# Patient Record
Sex: Female | Born: 1955 | Race: White | Hispanic: No | Marital: Married | Smoking: Current every day smoker
Health system: Southern US, Community
[De-identification: ages and names within clinical notes are randomized; demographics above are authoritative.]

## PROBLEM LIST (undated history)

## (undated) DIAGNOSIS — I10 Essential (primary) hypertension: Secondary | ICD-10-CM

## (undated) DIAGNOSIS — K219 Gastro-esophageal reflux disease without esophagitis: Secondary | ICD-10-CM

## (undated) DIAGNOSIS — G4710 Hypersomnia, unspecified: Secondary | ICD-10-CM

## (undated) DIAGNOSIS — F32A Depression, unspecified: Secondary | ICD-10-CM

## (undated) DIAGNOSIS — E039 Hypothyroidism, unspecified: Secondary | ICD-10-CM

## (undated) DIAGNOSIS — F419 Anxiety disorder, unspecified: Secondary | ICD-10-CM

## (undated) DIAGNOSIS — E7800 Pure hypercholesterolemia, unspecified: Secondary | ICD-10-CM

## (undated) HISTORY — PX: TUBAL LIGATION: SHX77

## (undated) HISTORY — PX: NASAL SEPTOPLASTY W/ TURBINOPLASTY: SHX2070

## (undated) HISTORY — DX: Pure hypercholesterolemia, unspecified: E78.00

## (undated) HISTORY — DX: Hypothyroidism, unspecified: E03.9

## (undated) HISTORY — PX: NASAL SINUS SURGERY: SHX719

## (undated) HISTORY — DX: Hypersomnia, unspecified: G47.10

---

## 1999-06-07 ENCOUNTER — Other Ambulatory Visit: Admission: RE | Admit: 1999-06-07 | Discharge: 1999-06-07 | Payer: Self-pay | Admitting: *Deleted

## 2000-10-02 ENCOUNTER — Other Ambulatory Visit: Admission: RE | Admit: 2000-10-02 | Discharge: 2000-10-02 | Payer: Self-pay | Admitting: *Deleted

## 2001-08-31 ENCOUNTER — Other Ambulatory Visit: Admission: RE | Admit: 2001-08-31 | Discharge: 2001-08-31 | Payer: Self-pay | Admitting: Obstetrics and Gynecology

## 2003-11-19 ENCOUNTER — Other Ambulatory Visit: Admission: RE | Admit: 2003-11-19 | Discharge: 2003-11-19 | Payer: Self-pay | Admitting: Obstetrics and Gynecology

## 2004-06-03 ENCOUNTER — Emergency Department (HOSPITAL_COMMUNITY): Admission: EM | Admit: 2004-06-03 | Discharge: 2004-06-03 | Payer: Self-pay | Admitting: Emergency Medicine

## 2004-06-16 ENCOUNTER — Emergency Department (HOSPITAL_COMMUNITY): Admission: EM | Admit: 2004-06-16 | Discharge: 2004-06-16 | Payer: Self-pay | Admitting: Emergency Medicine

## 2004-06-17 ENCOUNTER — Ambulatory Visit (HOSPITAL_COMMUNITY): Admission: RE | Admit: 2004-06-17 | Discharge: 2004-06-17 | Payer: Self-pay | Admitting: Emergency Medicine

## 2005-08-19 ENCOUNTER — Other Ambulatory Visit: Admission: RE | Admit: 2005-08-19 | Discharge: 2005-08-19 | Payer: Self-pay | Admitting: Obstetrics and Gynecology

## 2005-08-22 ENCOUNTER — Encounter: Admission: RE | Admit: 2005-08-22 | Discharge: 2005-09-28 | Payer: Self-pay | Admitting: Neurosurgery

## 2005-09-23 ENCOUNTER — Ambulatory Visit: Payer: Self-pay | Admitting: Physical Medicine & Rehabilitation

## 2005-09-23 ENCOUNTER — Encounter
Admission: RE | Admit: 2005-09-23 | Discharge: 2005-12-22 | Payer: Self-pay | Admitting: Physical Medicine & Rehabilitation

## 2005-11-17 ENCOUNTER — Ambulatory Visit: Payer: Self-pay | Admitting: Physical Medicine & Rehabilitation

## 2006-01-10 ENCOUNTER — Ambulatory Visit: Payer: Self-pay | Admitting: Physical Medicine & Rehabilitation

## 2006-01-10 ENCOUNTER — Encounter
Admission: RE | Admit: 2006-01-10 | Discharge: 2006-04-10 | Payer: Self-pay | Admitting: Physical Medicine & Rehabilitation

## 2006-05-05 ENCOUNTER — Ambulatory Visit: Payer: Self-pay | Admitting: Physical Medicine & Rehabilitation

## 2006-05-05 ENCOUNTER — Encounter
Admission: RE | Admit: 2006-05-05 | Discharge: 2006-08-03 | Payer: Self-pay | Admitting: Physical Medicine & Rehabilitation

## 2006-07-28 ENCOUNTER — Ambulatory Visit: Payer: Self-pay | Admitting: Physical Medicine & Rehabilitation

## 2006-07-28 ENCOUNTER — Encounter
Admission: RE | Admit: 2006-07-28 | Discharge: 2006-10-03 | Payer: Self-pay | Admitting: Physical Medicine & Rehabilitation

## 2006-09-27 ENCOUNTER — Other Ambulatory Visit: Admission: RE | Admit: 2006-09-27 | Discharge: 2006-09-27 | Payer: Self-pay | Admitting: Obstetrics and Gynecology

## 2006-10-20 ENCOUNTER — Encounter
Admission: RE | Admit: 2006-10-20 | Discharge: 2006-10-23 | Payer: Self-pay | Admitting: Physical Medicine & Rehabilitation

## 2006-10-20 ENCOUNTER — Ambulatory Visit: Payer: Self-pay | Admitting: Physical Medicine & Rehabilitation

## 2007-02-07 ENCOUNTER — Encounter
Admission: RE | Admit: 2007-02-07 | Discharge: 2007-02-08 | Payer: Self-pay | Admitting: Physical Medicine & Rehabilitation

## 2007-02-07 ENCOUNTER — Ambulatory Visit: Payer: Self-pay | Admitting: Physical Medicine & Rehabilitation

## 2007-05-25 ENCOUNTER — Encounter
Admission: RE | Admit: 2007-05-25 | Discharge: 2007-05-31 | Payer: Self-pay | Admitting: Physical Medicine & Rehabilitation

## 2007-05-31 ENCOUNTER — Ambulatory Visit: Payer: Self-pay | Admitting: Physical Medicine & Rehabilitation

## 2007-10-03 ENCOUNTER — Encounter
Admission: RE | Admit: 2007-10-03 | Discharge: 2007-10-03 | Payer: Self-pay | Admitting: Physical Medicine & Rehabilitation

## 2007-10-03 ENCOUNTER — Ambulatory Visit: Payer: Self-pay | Admitting: Physical Medicine & Rehabilitation

## 2008-01-22 ENCOUNTER — Encounter
Admission: RE | Admit: 2008-01-22 | Discharge: 2008-01-23 | Payer: Self-pay | Admitting: Physical Medicine & Rehabilitation

## 2008-01-23 ENCOUNTER — Ambulatory Visit: Payer: Self-pay | Admitting: Physical Medicine & Rehabilitation

## 2008-03-14 ENCOUNTER — Encounter
Admission: RE | Admit: 2008-03-14 | Discharge: 2008-05-12 | Payer: Self-pay | Admitting: Physical Medicine & Rehabilitation

## 2008-03-17 ENCOUNTER — Ambulatory Visit: Payer: Self-pay | Admitting: Physical Medicine & Rehabilitation

## 2008-04-21 ENCOUNTER — Ambulatory Visit: Payer: Self-pay | Admitting: Internal Medicine

## 2008-04-30 ENCOUNTER — Ambulatory Visit (HOSPITAL_COMMUNITY)
Admission: RE | Admit: 2008-04-30 | Discharge: 2008-04-30 | Payer: Self-pay | Admitting: Physical Medicine & Rehabilitation

## 2008-04-30 ENCOUNTER — Ambulatory Visit (HOSPITAL_BASED_OUTPATIENT_CLINIC_OR_DEPARTMENT_OTHER): Admission: RE | Admit: 2008-04-30 | Discharge: 2008-04-30 | Payer: Self-pay | Admitting: Internal Medicine

## 2008-05-07 ENCOUNTER — Ambulatory Visit: Payer: Self-pay | Admitting: Internal Medicine

## 2008-05-09 ENCOUNTER — Encounter: Payer: Self-pay | Admitting: Internal Medicine

## 2008-05-12 ENCOUNTER — Ambulatory Visit: Payer: Self-pay | Admitting: Physical Medicine & Rehabilitation

## 2008-05-16 ENCOUNTER — Ambulatory Visit: Payer: Self-pay | Admitting: Internal Medicine

## 2008-05-19 ENCOUNTER — Encounter: Payer: Self-pay | Admitting: Internal Medicine

## 2008-05-26 ENCOUNTER — Telehealth: Payer: Self-pay | Admitting: Internal Medicine

## 2008-06-11 ENCOUNTER — Encounter
Admission: RE | Admit: 2008-06-11 | Discharge: 2008-09-09 | Payer: Self-pay | Admitting: Physical Medicine & Rehabilitation

## 2008-06-18 ENCOUNTER — Telehealth: Payer: Self-pay | Admitting: Internal Medicine

## 2008-06-23 ENCOUNTER — Telehealth (INDEPENDENT_AMBULATORY_CARE_PROVIDER_SITE_OTHER): Payer: Self-pay | Admitting: *Deleted

## 2008-06-24 ENCOUNTER — Encounter: Payer: Self-pay | Admitting: Internal Medicine

## 2008-06-27 ENCOUNTER — Telehealth: Payer: Self-pay | Admitting: Internal Medicine

## 2008-07-14 ENCOUNTER — Telehealth: Payer: Self-pay | Admitting: Internal Medicine

## 2008-07-15 ENCOUNTER — Ambulatory Visit: Payer: Self-pay | Admitting: Physical Medicine & Rehabilitation

## 2008-07-15 ENCOUNTER — Telehealth (INDEPENDENT_AMBULATORY_CARE_PROVIDER_SITE_OTHER): Payer: Self-pay | Admitting: *Deleted

## 2008-08-14 ENCOUNTER — Ambulatory Visit: Payer: Self-pay | Admitting: Internal Medicine

## 2008-08-14 DIAGNOSIS — G4710 Hypersomnia, unspecified: Secondary | ICD-10-CM

## 2008-08-25 ENCOUNTER — Ambulatory Visit: Payer: Self-pay | Admitting: Physical Medicine & Rehabilitation

## 2008-09-04 ENCOUNTER — Telehealth (INDEPENDENT_AMBULATORY_CARE_PROVIDER_SITE_OTHER): Payer: Self-pay | Admitting: *Deleted

## 2008-10-01 ENCOUNTER — Telehealth: Payer: Self-pay | Admitting: Internal Medicine

## 2008-10-02 ENCOUNTER — Encounter
Admission: RE | Admit: 2008-10-02 | Discharge: 2008-12-24 | Payer: Self-pay | Admitting: Physical Medicine & Rehabilitation

## 2008-10-02 ENCOUNTER — Telehealth (INDEPENDENT_AMBULATORY_CARE_PROVIDER_SITE_OTHER): Payer: Self-pay | Admitting: *Deleted

## 2008-10-06 ENCOUNTER — Ambulatory Visit: Payer: Self-pay | Admitting: Physical Medicine & Rehabilitation

## 2008-10-07 ENCOUNTER — Telehealth: Payer: Self-pay | Admitting: Internal Medicine

## 2008-10-08 ENCOUNTER — Telehealth: Payer: Self-pay | Admitting: Internal Medicine

## 2008-10-09 ENCOUNTER — Telehealth (INDEPENDENT_AMBULATORY_CARE_PROVIDER_SITE_OTHER): Payer: Self-pay | Admitting: *Deleted

## 2008-11-03 ENCOUNTER — Ambulatory Visit: Payer: Self-pay | Admitting: Physical Medicine & Rehabilitation

## 2008-11-11 ENCOUNTER — Telehealth (INDEPENDENT_AMBULATORY_CARE_PROVIDER_SITE_OTHER): Payer: Self-pay | Admitting: *Deleted

## 2008-12-02 ENCOUNTER — Telehealth (INDEPENDENT_AMBULATORY_CARE_PROVIDER_SITE_OTHER): Payer: Self-pay | Admitting: *Deleted

## 2008-12-12 ENCOUNTER — Telehealth: Payer: Self-pay | Admitting: Internal Medicine

## 2009-01-07 ENCOUNTER — Telehealth: Payer: Self-pay | Admitting: Internal Medicine

## 2009-02-19 ENCOUNTER — Encounter
Admission: RE | Admit: 2009-02-19 | Discharge: 2009-05-20 | Payer: Self-pay | Admitting: Physical Medicine & Rehabilitation

## 2009-03-05 ENCOUNTER — Ambulatory Visit: Payer: Self-pay | Admitting: Internal Medicine

## 2009-03-06 ENCOUNTER — Ambulatory Visit: Payer: Self-pay | Admitting: Physical Medicine & Rehabilitation

## 2009-03-06 ENCOUNTER — Encounter: Payer: Self-pay | Admitting: Internal Medicine

## 2009-03-09 ENCOUNTER — Telehealth: Payer: Self-pay | Admitting: Internal Medicine

## 2009-03-15 ENCOUNTER — Encounter: Payer: Self-pay | Admitting: Internal Medicine

## 2009-03-16 ENCOUNTER — Encounter: Payer: Self-pay | Admitting: Internal Medicine

## 2009-04-02 ENCOUNTER — Telehealth: Payer: Self-pay | Admitting: Internal Medicine

## 2009-04-27 ENCOUNTER — Telehealth: Payer: Self-pay | Admitting: Internal Medicine

## 2009-04-28 ENCOUNTER — Ambulatory Visit: Payer: Self-pay | Admitting: Physical Medicine & Rehabilitation

## 2009-05-22 ENCOUNTER — Telehealth: Payer: Self-pay | Admitting: Internal Medicine

## 2009-05-26 ENCOUNTER — Telehealth (INDEPENDENT_AMBULATORY_CARE_PROVIDER_SITE_OTHER): Payer: Self-pay | Admitting: *Deleted

## 2009-06-03 ENCOUNTER — Telehealth: Payer: Self-pay | Admitting: Internal Medicine

## 2009-06-22 ENCOUNTER — Telehealth: Payer: Self-pay | Admitting: Internal Medicine

## 2009-07-10 ENCOUNTER — Encounter
Admission: RE | Admit: 2009-07-10 | Discharge: 2009-07-20 | Payer: Self-pay | Admitting: Physical Medicine & Rehabilitation

## 2009-07-20 ENCOUNTER — Ambulatory Visit: Payer: Self-pay | Admitting: Physical Medicine & Rehabilitation

## 2009-07-20 ENCOUNTER — Telehealth (INDEPENDENT_AMBULATORY_CARE_PROVIDER_SITE_OTHER): Payer: Self-pay | Admitting: *Deleted

## 2009-08-13 ENCOUNTER — Telehealth (INDEPENDENT_AMBULATORY_CARE_PROVIDER_SITE_OTHER): Payer: Self-pay | Admitting: *Deleted

## 2009-08-27 ENCOUNTER — Telehealth (INDEPENDENT_AMBULATORY_CARE_PROVIDER_SITE_OTHER): Payer: Self-pay | Admitting: *Deleted

## 2009-09-15 ENCOUNTER — Ambulatory Visit: Payer: Self-pay | Admitting: Internal Medicine

## 2009-10-12 ENCOUNTER — Telehealth (INDEPENDENT_AMBULATORY_CARE_PROVIDER_SITE_OTHER): Payer: Self-pay | Admitting: *Deleted

## 2009-10-13 ENCOUNTER — Encounter
Admission: RE | Admit: 2009-10-13 | Discharge: 2009-10-13 | Payer: Self-pay | Source: Home / Self Care | Attending: Physical Medicine & Rehabilitation | Admitting: Physical Medicine & Rehabilitation

## 2009-10-13 ENCOUNTER — Encounter
Admission: RE | Admit: 2009-10-13 | Discharge: 2009-10-23 | Payer: Self-pay | Admitting: Physical Medicine & Rehabilitation

## 2009-10-15 ENCOUNTER — Telehealth (INDEPENDENT_AMBULATORY_CARE_PROVIDER_SITE_OTHER): Payer: Self-pay | Admitting: *Deleted

## 2009-10-19 ENCOUNTER — Telehealth (INDEPENDENT_AMBULATORY_CARE_PROVIDER_SITE_OTHER): Payer: Self-pay | Admitting: *Deleted

## 2009-10-22 ENCOUNTER — Telehealth (INDEPENDENT_AMBULATORY_CARE_PROVIDER_SITE_OTHER): Payer: Self-pay | Admitting: *Deleted

## 2009-11-10 ENCOUNTER — Telehealth (INDEPENDENT_AMBULATORY_CARE_PROVIDER_SITE_OTHER): Payer: Self-pay | Admitting: *Deleted

## 2009-12-03 ENCOUNTER — Telehealth: Payer: Self-pay | Admitting: Internal Medicine

## 2009-12-08 ENCOUNTER — Ambulatory Visit: Payer: Self-pay | Admitting: Cardiology

## 2009-12-30 ENCOUNTER — Telehealth: Payer: Self-pay | Admitting: Internal Medicine

## 2010-01-19 ENCOUNTER — Telehealth (INDEPENDENT_AMBULATORY_CARE_PROVIDER_SITE_OTHER): Payer: Self-pay | Admitting: *Deleted

## 2010-02-02 NOTE — Assessment & Plan Note (Signed)
Summary: med not working, what are other options/apc   Copy to:  Ronny Flurry Primary Provider/Referring Provider:  Adair Laundry bill  CC:  Meds not working-Methocarbamol and Methylphenidate.Marland Kitchen  History of Present Illness: 04/21/08- New Sleep Medicine consult for Dr Patty Sermons- 55 YOF with sleep disordered breathing. She has been more aware of comments about loud snoring in past 6 months. Fights daytime sleepiness if quiet. Husband tells her she stops breathing.  She was better during interval when she worked off 77 lbs, but she gained it all back.. Few nap opportunities. occasional soft drink. No sleep meds. Bedtime 10-11 PM, 5-10 minute latency, waking 2-3 times before final awakening 4:45 AM.   05/16/08- Snoring/ fatigue NPSG AHI 0/hr. Reviewed with her. Husband notes her snoring. She sleeps soundly. difficult to get up. Sound sleeper. Normal HS 10PM. Up 445AM. Discussed sleep hygiene. She thinks earlier bedtime no more refreshing.  08/14/08- Hypersomnia, snoring (AHI 0/hr) She says methylphenidate has been "wonderfull"= sleeping well and denying headache, palpitation, chest pain, mood swings. She is taking a 20 plus a 10 mg and asks for a 30 mg tab. We discussed Adderall and alternative dosing forms.   March 05, 2009- Hypersomnia, snoring (AHI 0/hr) Falling asleep and snoring at work. Sleeps soundly at night. Not taking sleep meds. Taking alprazolam 3-4 daily of 0.5mg  tabs. She thinks she could get by with less. Taking methylphenidate 10 + 20 mg in AM and again at lunch. Denies headache, palpitation, chest pain or mood swings.  Taking hydrocodone pain med twice daily. Thryoid function is being supervised. Bedtime 9-10PM, up 430-5AM. No deliberate naps.   Current Medications (verified): 1)  Levoxyl 137 Mcg Tabs (Levothyroxine Sodium) .... Take 1 Tablet By Mouth Once A Day 2)  Paroxetine Hcl 20 Mg Tabs (Paroxetine Hcl) .... Take 1 Tablet By Mouth Once A Day 3)  Alprazolam 0.5 Mg Tabs  (Alprazolam) .... Take 1 Tablet By Mouth Four Times A Day As Needed 4)  Hydrocodone-Acetaminophen 10-325 Mg Tabs (Hydrocodone-Acetaminophen) .... Take 1 Tablet By Mouth Three Times A Day As Needed 5)  Methocarbamol 750 Mg Tabs (Methocarbamol) .... Take 1 Tablet By Mouth Three Times A Day As Needed 6)  Methylphenidate Hcl 10 Mg Tabs (Methylphenidate Hcl) .Marland Kitchen.. 1 Two Times A Day As Needed Supplement 7)  Multivitamins  Tabs (Multiple Vitamin) .... Take 1 By Mouth Once Daily 8)  Methylphenidate Hcl 20 Mg Tabs (Methylphenidate Hcl) .... Take 1 Tablet By Mouth Two Times A Day As Needed  Allergies (verified): 1)  ! Phenergan  Past History:  Past Medical History: Last updated: 05/16/2008 Hypothyroid- Dr Evlyn Kanner  Hypersomnia- NPSG AHI 0/hr  Past Surgical History: Last updated: 04/21/2008 Nasal septoplasty Sinus surgery Tubal ligation  Family History: Last updated: 04/21/2008 father - emphysema strong history of colon cancer in materal aunts/uncles - 6 of 12 have been diagnosed.  Social History: Last updated: 04/21/2008 married twins current smoker x30yrs, 1/2 to 3/4ppd rare alcohol - wine  Risk Factors: Smoking Status: current (04/21/2008) Packs/Day: 1/2 - 3/4 (04/21/2008)  Review of Systems      See HPI  The patient denies anorexia, fever, weight loss, weight gain, vision loss, decreased hearing, hoarseness, chest pain, syncope, dyspnea on exertion, peripheral edema, prolonged cough, headaches, hemoptysis, and severe indigestion/heartburn.    Vital Signs:  Patient profile:   55 year old female Height:      64 inches Weight:      195.25 pounds O2 Sat:      100 %  on Room air Pulse rate:   65 / minute BP sitting:   114 / 62  (left arm) Cuff size:   regular  Vitals Entered By: Reynaldo Minium CMA (March 05, 2009 10:23 AM)  O2 Flow:  Room air  Physical Exam  Additional Exam:  General: A/Ox3; pleasant and cooperative, NAD, mild overweight, very subdued/ calm,  responsive SKIN: no rash, lesions NODES: no lymphadenopathy HEENT: [place]/AT, EOM- WNL, Conjuctivae- clear, PERRLA, exophthalmos, TM-WNL, Nose- clear, Throat- clear and wnl, Mallampati  II-III NECK: Supple w/ fair ROM, JVD- none, normal carotid impulses w/o bruits Thyroid-  CHEST: Clear to P&A HEART: RRR, no m/g/r heard ABDOMEN:  ZOX:WRUE, nl pulses, no edema  NEURO: Grossly intact to observation      Impression & Recommendations:  Problem # 1:  HYPERSOMNIA, IDIOPATHIC (ICD-780.54)  She is building a tolerance to methylphenadate and we can change to Adderal for trial. It is important that she reduce alprazolam to 1/2 tab four times a day. Also it may help to take deliberate naps on her lunch hour.  Medications Added to Medication List This Visit: 1)  Methylphenidate Hcl 10 Mg Tabs (Methylphenidate hcl) .Marland Kitchen.. 1 two times a day as needed supplement 2)  Methylphenidate Hcl 20 Mg Tabs (Methylphenidate hcl) .... Take 1 tablet by mouth two times a day as needed 3)  Amphetamine-dextroamphetamine 10 Mg Tabs (Amphetamine-dextroamphetamine) .... Up to 4 daily as needed 4)  Amphetamine-dextroamphetamine 30 Mg Xr24h-cap (Amphetamine-dextroamphetamine) .Marland Kitchen.. 1 three times a day as needed  Other Orders: Est. Patient Level III (45409)  Patient Instructions: 1)  Please schedule a follow-up appointment in 2 months. 2)  Try to get deliberate naps on your timing- maybe 30 minutes at lunch. 3)  Change from methylphenadate to adderall/ amphetamine/ dextroamphetamine as per the script 4)  Try to cut your alprazolam in half- perhaps by taking 1/2 instead of a whole tab. You don't need it for stress if you are falling asleep. Prescriptions: AMPHETAMINE-DEXTROAMPHETAMINE 30 MG XR24H-CAP (AMPHETAMINE-DEXTROAMPHETAMINE) 1 three times a day as needed  #90 x 0   Entered and Authorized by:   Waymon Budge MD   Signed by:   Waymon Budge MD on 03/05/2009   Method used:   Print then Give to Patient   RxID:    [id number] AMPHETAMINE-DEXTROAMPHETAMINE 10 MG TABS (AMPHETAMINE-DEXTROAMPHETAMINE) up to 4 daily as needed  #120 x 0   Entered and Authorized by:   Waymon Budge MD   Signed by:   Waymon Budge MD on 03/05/2009   Method used:   Print then Give to Patient   RxID:   [id number]

## 2010-02-02 NOTE — Progress Notes (Signed)
Summary: prescript  Phone Note Call from Patient Call back at [phone]   Caller: Patient Call For: young Summary of Call: need prescript for amphetamine salts er 30mg  and 10mg  will pick up Initial call taken by: Rickard Patience,  April 02, 2009 11:31 AM  Follow-up for Phone Call        rx printed and placed on CY look-at to sign. [name] CMA  April 02, 2009 11:33 AM    Pt aware of RX at front desk.Reynaldo Minium CMA  April 02, 2009 11:52 AM     Prescriptions: AMPHETAMINE-DEXTROAMPHETAMINE 30 MG XR24H-CAP (AMPHETAMINE-DEXTROAMPHETAMINE) 1 three times a day as needed  #90 x 0   Entered by:   [name] CMA   Authorized by:   Waymon Budge MD   Signed by:   [name] CMA on 04/02/2009   Method used:   Print then Give to Patient   RxID:   [id number] AMPHETAMINE-DEXTROAMPHETAMINE 10 MG TABS (AMPHETAMINE-DEXTROAMPHETAMINE) up to 4 daily as needed  #120 x 0   Entered by:   [name] CMA   Authorized by:   Waymon Budge MD   Signed by:   [name] CMA on 04/02/2009   Method used:   Print then Give to Patient   RxID:   [id number]

## 2010-02-02 NOTE — Progress Notes (Signed)
Summary: rx  Phone Note Call from Patient Call back at [phone]   Caller: Patient Call For: young Summary of Call: pt needs rx for Methylphenidate 10mg  #180 for a 90 day supply thru [facility].  Not due until01/13, but she wants it mailed to her home and then she has to send to [facility]. Initial call taken by: Eugene Gavia,  January 07, 2009 2:52 PM  Follow-up for Phone Call        rx placed for CY to sign with envelope to mail to pt. Pt aware. [name] CMA  January 07, 2009 3:30 PM     Prescriptions: METHYLPHENIDATE HCL 10 MG TABS (METHYLPHENIDATE HCL) 1 two times a day as needed supplement  #180 x 0   Entered by:   [name] CMA   Authorized by:   Waymon Budge MD   Signed by:   [name] CMA on 01/07/2009   Method used:   Print then Mail to Patient   RxID:   [id number]

## 2010-02-02 NOTE — Progress Notes (Signed)
Summary: ritilin question   Phone Note Call from Patient Call back at [phone]   Caller: Patient Call For: young Summary of Call: please call pt about her ritilin rx  Initial call taken by: Lacinda Axon,  October 19, 2009 5:26 PM  Follow-up for Phone Call        Pt called to say have called to several pharmacycannot find one that has 5mg , found one that has the extended release in the 5mg  and the 10mg  @ [facility]   Pt wants to know is that an option for her,  if so can I get it in the 10mg ? instead of 2 rx for 5mg ?  Pt advised will call in am .Kandice Hams [facility]  October 19, 2009 5:37 PM  Follow-up by: Kandice Hams CMA,  October 19, 2009 5:37 PM  Additional Follow-up for Phone Call Additional follow up Details #1::        Spoke with [facility] [address]- they have about #300 of Adderall 15mg  tablets. Is this an option to give to patient? Reynaldo Minium CMA  October 21, 2009 12:04 PM  Additional Follow-up by: Waymon Budge MD,  October 21, 2009 12:13 PM    Additional Follow-up for Phone Call Additional follow up Details #2::    I We can let her try Adderall 15 mg as a supplement to her three times a day 30 mg XR tabs, if needed. I don't know if the 15's can be broken.  I have changed the med list to the 15s for her to try.  Follow-up by: Waymon Budge MD,  October 21, 2009 12:17 PM  Additional Follow-up for Phone Call Additional follow up Details #3:: Details for Additional Follow-up Action Taken: Pt is aware of new rx given and will come by here to pick up-at front desk.Reynaldo Minium CMA  October 21, 2009 12:21 PM   New/Updated Medications: ADDERALL 15 MG TABS (AMPHETAMINE-DEXTROAMPHETAMINE) 1 two times a day as needed Prescriptions: ADDERALL 15 MG TABS (AMPHETAMINE-DEXTROAMPHETAMINE) 1 two times a day as needed  #30 x 0   Entered by:   Reynaldo Minium CMA   Authorized by:   Waymon Budge MD   Signed by:   Reynaldo Minium CMA on 10/21/2009   Method used:   Print then Give to  Patient   RxID:   [id number] ADDERALL 15 MG TABS (AMPHETAMINE-DEXTROAMPHETAMINE) 1 two times a day as needed  #30 x 0   Entered and Authorized by:   Waymon Budge MD   Signed by:   Waymon Budge MD on 10/21/2009   Method used:   Historical   RxID:   [id number]

## 2010-02-02 NOTE — Progress Notes (Signed)
Summary: Adderall RX---two different strengths  Phone Note Call from Patient Call back at [phone]   Caller: Patient Call For: young Reason for Call: Talk to Nurse Summary of Call: Patient asking for written rx that see usually picks up.  I asked for the names of the medications she needed scripts for, but I could not understand which meds she was needing. Initial call taken by: Lehman Prom,  December 03, 2009 10:00 AM  Follow-up for Phone Call        # left for callback disconnected called pt on home # LMOMTCB x 1 need to verify refill request. Zackery Barefoot CMA  December 03, 2009 10:05 AM   Pt is requesting new RX for her Adderall. However, she would like to get the Adderall 10mg , instead of, the Adderall 15mg  because this worked for her. She also says that she usually gets #120 on the 10mg . Will also need Adderall XR 30mg . No RX printed due to requested change in dosage. Pls advise or clarify on med dosage for both prescriptions and quantity. Follow-up by: Michel Bickers CMA,  December 03, 2009 10:27 AM  Additional Follow-up for Phone Call Additional follow up Details #1::        ok    New/Updated Medications: AMPHETAMINE-DEXTROAMPHETAMINE 10 MG TABS (AMPHETAMINE-DEXTROAMPHETAMINE) 4 daily as needed Prescriptions: AMPHETAMINE-DEXTROAMPHETAMINE 30 MG XR24H-CAP (AMPHETAMINE-DEXTROAMPHETAMINE) 1 three times a day as needed  #90 x 0   Entered and Authorized by:   Waymon Budge MD   Signed by:   Waymon Budge MD on 12/03/2009   Method used:   Print then Give to Patient   RxID:   [id number] AMPHETAMINE-DEXTROAMPHETAMINE 10 MG TABS (AMPHETAMINE-DEXTROAMPHETAMINE) 4 daily as needed  #120 x 0   Entered and Authorized by:   Waymon Budge MD   Signed by:   Waymon Budge MD on 12/03/2009   Method used:   Print then Give to Patient   RxID:   [id number]   Appended Document: Adderall RX---two different strengths Pt is aware that RXs ready for pick up at front.

## 2010-02-02 NOTE — Medication Information (Signed)
Summary: Tax adviser   Imported By: Lehman Prom 03/16/2009 14:38:32  _____________________________________________________________________  External Attachment:    Type:   Image     Comment:   External Document

## 2010-02-02 NOTE — Progress Notes (Signed)
Summary: ? drug interaction.  Phone Note Call from Patient Call back at [phone]   Caller: Patient Call For: Tyrell Seifer Reason for Call: Talk to Nurse Summary of Call: pt thinks her med for narcolepsy and her thyroid med may be interacting.  No energy, tired all the time, please advise.  Pharmacist said they shouldn't be interacting. Initial call taken by: Eugene Gavia,  June 03, 2009 10:51 AM  Follow-up for Phone Call        ATC pt at number provided.  LMOM TCB x1.    there is a massage in EMR 05-22-09 where patient was concerned about her syntroid and amphetamine and she was told to contact Dr. Rinaldo Cloud office.   Boone Master CNA/MA  June 03, 2009 11:30 AM   pt returned call.  she states that she did call Dr. Rinaldo Cloud office who stated that the 2 meds should not be interacting and suggested she contact [facility]Y for his recs.  patient states "something is not right" - she is lethargic and tired all the time, and states it seems like she was never on the amphetamine, although it "worked great" before the synthroid was increased 3weeks ago to the .  synthroid will be increased again to starting tomorrow x5weeks.  pt is very frustrated and states that she called out from work today b/c she was afraid she would fall asleep at work.  CD[facility], please advise your recs, thanks! Follow-up by: Boone Master CNA/MA,  June 03, 2009 11:41 AM  Additional Follow-up for Phone Call Additional follow up Details #1::        I agree- doubt interaction that would make her tired. Is she sleeping at night? Does a nap help what she is complaining about now? ask pharmacy if they changed her thryoid med to a different manufacturer that might be somewhat different. Note that alprazolam and hydrocodone cough syrup can cause drowsiness.     Additional Follow-up by: Waymon Budge MD,  June 03, 2009 12:10 PM    Additional Follow-up for Phone Call Additional follow up Details #2::    pt returned call and is  waiting for a call back from triage.  [phone] Follow-up by: Eugene Gavia,  June 03, 2009 2:17 PM  Additional Follow-up for Phone Call Additional follow up Details #3:: Details for Additional Follow-up Action Taken: pt advised. She staets she is sleeping well at night, and naps aren't really possible at work. She does state she was on levoxyl and is now on name brand synthroid, so I advised her to contact the md that prescribes this med and maybe discuss with him going back to levolxyl. Pt states she will do so. [name] CMA  June 03, 2009 2:27 PM

## 2010-02-02 NOTE — Progress Notes (Signed)
Summary: refills   Phone Note Call from Patient Call back at [phone]   Caller: Patient Call For: young Reason for Call: Refill Medication Summary of Call: Needs written rxs for amephetamine-dextroamphetamine 30mg  and amphetamine-dextroamphetamine 10mg . Pls call when ready. Initial call taken by: Darletta Moll,  October 12, 2009 4:45 PM  Follow-up for Phone Call        Last OV : 09/15/09 Pending OV : 09/14/2010 Rxs printed and placed on CY's cart for signature.  Gweneth Dimitri RN  October 12, 2009 5:01 PM   Additional Follow-up for Phone Call Additional follow up Details #1::        Is this completed? Additional Follow-up by: Waymon Budge MD,  October 13, 2009 8:37 AM    Additional Follow-up for Phone Call Additional follow up Details #2::    Please call patient and let her know that the RX is at front for pick up.Reynaldo Minium CMA  October 13, 2009 8:54 AM   Call # provided above - [facility] Gweneth Dimitri RN  October 13, 2009 9:04 AM PT RETURNED CALL FROM [place]. Tivis Ringer, CNA  October 13, 2009 9:18 AM  Called, spoke with pt.  She was informed rxs at front for pick up and verbalized understanding.   Gweneth Dimitri RN  October 13, 2009 9:24 AM   Prescriptions: AMPHETAMINE-DEXTROAMPHETAMINE 30 MG XR24H-CAP (AMPHETAMINE-DEXTROAMPHETAMINE) 1 three times a day as needed  #90 x 0   Entered by:   Gweneth Dimitri RN   Authorized by:   Waymon Budge MD   Signed by:   Gweneth Dimitri RN on 10/12/2009   Method used:   Print then Give to Patient   RxID:   [id number] AMPHETAMINE-DEXTROAMPHETAMINE 10 MG TABS (AMPHETAMINE-DEXTROAMPHETAMINE) up to 4 daily as needed  #120 x 0   Entered by:   Gweneth Dimitri RN   Authorized by:   Waymon Budge MD   Signed by:   Gweneth Dimitri RN on 10/12/2009   Method used:   Print then Give to Patient   RxID:   [id number]

## 2010-02-02 NOTE — Progress Notes (Signed)
Summary: copy of letter  Phone Note Call from Patient Call back at [phone]   Caller: Patient Call For: young Reason for Call: Talk to Nurse Summary of Call: can you send a copy of letter written 12/03/2008 to pt - email to:  [email or web address] or mail to pt's address. Initial call taken by: Eugene Gavia,  August 27, 2009 1:37 PM  Follow-up for Phone Call        letter sent to pt's home addresd which I verified with pt was the correct address we had on file.  pt aware.  Aundra Millet Reynolds LPN  August 27, 2009 1:44 PM

## 2010-02-02 NOTE — Progress Notes (Signed)
Summary: PRESCRIPTION  Phone Note Call from Patient   Caller: Patient Call For: Shannan Slinker Summary of Call: PT NEED WRITTEN PRESCRIPT FOR AMTHETAMIDE SALTS ER 30MG  QUANITY 90 AND AMTHETAMIDE 10mg   qty-120 call when ready- PT WILL PICK UP Initial call taken by: Rickard Patience,  June 22, 2009 2:52 PM  Follow-up for Phone Call        rx's printed and sent to cy for signature will call pt when ready Follow-up by: Philipp Deputy CMA,  June 22, 2009 3:34 PM  Additional Follow-up for Phone Call Additional follow up Details #1::        called and spoke with pt and she is aware rx are ready to be picked up. Randell Loop CMA  June 22, 2009 4:03 PM     Prescriptions: AMPHETAMINE-DEXTROAMPHETAMINE 30 MG XR24H-CAP (AMPHETAMINE-DEXTROAMPHETAMINE) 1 three times a day as needed  #90 x 0   Entered by:   Philipp Deputy CMA   Authorized by:   Waymon Budge MD   Signed by:   Philipp Deputy CMA on 06/22/2009   Method used:   Print then Give to Patient   RxID:   [id number] AMPHETAMINE-DEXTROAMPHETAMINE 10 MG TABS (AMPHETAMINE-DEXTROAMPHETAMINE) up to 4 daily as needed  #120 x 0   Entered by:   Philipp Deputy CMA   Authorized by:   Waymon Budge MD   Signed by:   Philipp Deputy CMA on 06/22/2009   Method used:   Print then Give to Patient   RxID:   [id number]

## 2010-02-02 NOTE — Letter (Signed)
Summary: Drug Screening/[facility]  Drug Screening/[facility]   Imported By: Sherian Rein 04/08/2009 09:43:24  _____________________________________________________________________  External Attachment:    Type:   Image     Comment:   External Document

## 2010-02-02 NOTE — Progress Notes (Signed)
Summary: prescript  Phone Note Call from Patient Call back at [phone]   Caller: Patient Call For: young Summary of Call: need written prescript for amphetamine salts er 30mg  and 10mg  . she will pick up when ready Initial call taken by: Rickard Patience,  May 26, 2009 9:08 AM  Follow-up for Phone Call        Rx printed and placed on CY look-at to sign. [name] CMA  May 26, 2009 9:22 AM    Additional Follow-up for Phone Call Additional follow up Details #1::        RX at front for pick up; pt aware. Reynaldo Minium CMA  May 26, 2009 11:14 AM     Prescriptions: AMPHETAMINE-DEXTROAMPHETAMINE 30 MG XR24H-CAP (AMPHETAMINE-DEXTROAMPHETAMINE) 1 three times a day as needed  #90 x 0   Entered by:   [name] CMA   Authorized by:   Waymon Budge MD   Signed by:   [name] CMA on 05/26/2009   Method used:   Print then Give to Patient   RxID:   [id number] AMPHETAMINE-DEXTROAMPHETAMINE 10 MG TABS (AMPHETAMINE-DEXTROAMPHETAMINE) up to 4 daily as needed  #120 x 0   Entered by:   [name] CMA   Authorized by:   Waymon Budge MD   Signed by:   [name] CMA on 05/26/2009   Method used:   Print then Give to Patient   RxID:   [id number]

## 2010-02-02 NOTE — Progress Notes (Signed)
Summary: rx  Phone Note Call from Patient Call back at [phone]   Caller: Patient Call For: young Summary of Call: salts 30mg  ER, salts 10mg  - need written rx mailed to pt's home. Initial call taken by: Eugene Gavia,  August 13, 2009 10:16 AM  Follow-up for Phone Call        once rx's are signed pls give to [facility] to mail to pt Follow-up by: Philipp Deputy CMA,  August 13, 2009 10:41 AM  Additional Follow-up for Phone Call Additional follow up Details #1::        Mailed to patient.Reynaldo Minium CMA  August 13, 2009 11:16 AM     Prescriptions: AMPHETAMINE-DEXTROAMPHETAMINE 30 MG XR24H-CAP (AMPHETAMINE-DEXTROAMPHETAMINE) 1 three times a day as needed  #90 x 0   Entered by:   Philipp Deputy CMA   Authorized by:   Waymon Budge MD   Signed by:   Philipp Deputy CMA on 08/13/2009   Method used:   Print then Give to Patient   RxID:   [id number] AMPHETAMINE-DEXTROAMPHETAMINE 10 MG TABS (AMPHETAMINE-DEXTROAMPHETAMINE) up to 4 daily as needed  #120 x 0   Entered by:   Philipp Deputy CMA   Authorized by:   Waymon Budge MD   Signed by:   Philipp Deputy CMA on 08/13/2009   Method used:   Print then Give to Patient   RxID:   [id number]

## 2010-02-02 NOTE — Progress Notes (Signed)
Summary: problem w/ getting rx----Amphetamine 10mg --change to 5mg   Phone Note Call from Patient   Caller: Patient Call For: young Summary of Call: pt states that she has taken her rx for amphetamine 10 mg to 5 different drug stores. unable to get this but they have no problem w/ the 30mg  tabs. call pt on cell [phone] NOTE: call pt later today as she is still in our office re: billing questions for stacy.  Initial call taken by: Tivis Ringer, CNA,  October 15, 2009 2:42 PM  Follow-up for Phone Call        pt is waiting in the lobby to speak to [facility] first regarding her billing.  Kennyth Arnold knows to send the pt to me once she is finished so I can discuss this triage message with her.  Aundra Millet Reynolds LPN  October 15, 2009 2:57 PM   spoke with pt.  pt states she has taken the rx for Amphetamine 10mg  that we gave her on 10/12/2009 to [facility] [address] [place] [address], [place], [facility], and [place].  All of these pharmacies do not have the 10mg  tabs.  She is told they are on back order.  Therefore pt states she has had to use her 30mg  tabs and will run out before she is due for another refill.  Pt requests CY recs regarding this.  WIll forward message to CY to address. Arman Filter LPN  October 15, 2009 3:49 PM   Additional Follow-up for Phone Call Additional follow up Details #1::        I have changed 10 mg to 5 mg and written a trial script Please notify her - mail or pick up. Additional Follow-up by: Waymon Budge MD,  October 15, 2009 7:49 PM    Additional Follow-up for Phone Call Additional follow up Details #2::    Spoke with pt and she states that she would like to have rx mailed to her.  Verified her home address. Placed rx with envelope on CDYs cart to sign. Follow-up by: Vernie Murders,  October 16, 2009 9:09 AM  Additional Follow-up for Phone Call Additional follow up Details #3:: Details for Additional Follow-up Action  Taken: mailed signed rx to pt's home address. Marland KitchenArman Filter LPN  October 16, 2009 1:04 PM   New/Updated Medications: AMPHETAMINE-DEXTROAMPHETAMINE 5 MG TABS (AMPHETAMINE-DEXTROAMPHETAMINE) 1-2 four times a day as needed Prescriptions: AMPHETAMINE-DEXTROAMPHETAMINE 5 MG TABS (AMPHETAMINE-DEXTROAMPHETAMINE) 1-2 four times a day as needed  #100 x 0   Entered by:   Vernie Murders   Authorized by:   Waymon Budge MD   Signed by:   Vernie Murders on 10/16/2009   Method used:   Print then Give to Patient   RxID:   [id number] AMPHETAMINE-DEXTROAMPHETAMINE 5 MG TABS (AMPHETAMINE-DEXTROAMPHETAMINE) 1-2 four times a day as needed  #100 x 0   Entered by:   Waymon Budge MD   Authorized by:   Pulmonary Triage   Signed by:   Waymon Budge MD on 10/15/2009   Method used:   Historical   RxID:   [id number]

## 2010-02-02 NOTE — Progress Notes (Signed)
Summary: rx  Phone Note Call from Patient   Caller: Patient Call For: Crystal Kirby Reason for Call: Refill Medication, Talk to Nurse Summary of Call: pt needs to pick up 2 rx's - Amphetamine salts ER 30mg  capsule and 10mg  tablet.  Will pick up. Initial call taken by: Eugene Gavia,  April 27, 2009 9:33 AM  Follow-up for Phone Call        rx printed and placed on CY look-at. [name] CMA  April 27, 2009 11:00 AM   Additional Follow-up for Phone Call Additional follow up Details #1::        Pt aware that Rx is at front for pick up.Reynaldo Minium CMA  April 27, 2009 11:18 AM     Prescriptions: AMPHETAMINE-DEXTROAMPHETAMINE 30 MG XR24H-CAP (AMPHETAMINE-DEXTROAMPHETAMINE) 1 three times a day as needed  #90 x 0   Entered by:   [name] CMA   Authorized by:   Waymon Budge MD   Signed by:   [name] CMA on 04/27/2009   Method used:   Print then Give to Patient   RxID:   [id number] AMPHETAMINE-DEXTROAMPHETAMINE 10 MG TABS (AMPHETAMINE-DEXTROAMPHETAMINE) up to 4 daily as needed  #120 x 0   Entered by:   [name] CMA   Authorized by:   Waymon Budge MD   Signed by:   [name] CMA on 04/27/2009   Method used:   Print then Give to Patient   RxID:   [id number]

## 2010-02-02 NOTE — Progress Notes (Signed)
Summary: discuss medication  Phone Note Call from Patient   Caller: Patient Call For: Kelie Gainey Summary of Call: need to discuss medication Initial call taken by: Rickard Patience,  May 22, 2009 11:17 AM  Follow-up for Phone Call        [facility]. [name] CMA  May 22, 2009 11:27 AM  LMOMTCB Zackery Barefoot CMA  May 25, 2009 11:34 AM   pt is calling back again [phone] .Marland KitchenChantel Bowne  May 25, 2009 12:53 PM  Called pt back at the above # and NA LMTCB Vernie Murders  May 25, 2009 2:53 PM   Additional Follow-up for Phone Call Additional follow up Details #1::        Spoke with pt.  She states that her thyroid med had to be increased from 137 micrograms to 150 micrograms per Dr Evlyn Kanner.  She states that since then she has felt very tired.  She wonders if "meds are working against each other" referring to synthroid and amphetamine salts.  I advised that she needs to call and speak with Dr Evlyn Kanner since he is dealing with her thyroid and adjusting her meds.  Advised that I will forward msg to Dr Maple Hudson as well.  Please advise thanks Additional Follow-up by: Vernie Murders,  May 25, 2009 3:04 PM    Additional Follow-up for Phone Call Additional follow up Details #2::    This is a change she has noted with adjustment of her thryoid med. I doubt "drug interaction", but ask she discuss with Dr Evlyn Kanner. Follow-up by: Waymon Budge MD,  May 26, 2009 8:50 AM  Additional Follow-up for Phone Call Additional follow up Details #3:: Details for Additional Follow-up Action Taken: [facility]. [name] CMA  May 26, 2009 9:11 AM pt advised.[name] CMA  May 27, 2009 9:58 AM

## 2010-02-02 NOTE — Medication Information (Signed)
Summary: Prior Auth for Dextroamphetamine/[facility]  Prior Auth for Dextroamphetamine/[employer]   Imported By: Sherian Rein 03/18/2009 14:09:47  _____________________________________________________________________  External Attachment:    Type:   Image     Comment:   External Document

## 2010-02-02 NOTE — Progress Notes (Signed)
Summary: PRESCRIPT  Phone Note Call from Patient Call back at [phone]   Caller: Patient Call For: Crystal Kirby Summary of Call: need prescript for amphatamine salts ER EXTENDED RELEASE 30MG  AND 10MG  TABS WILL PICK UP. Initial call taken by: Rickard Patience,  July 20, 2009 8:12 AM  Follow-up for Phone Call        rx have been printed out and placed on CY cart to be signed. Randell Loop [facility]  July 20, 2009 8:46 AM   Additional Follow-up for Phone Call Additional follow up Details #1::        RX is signed and pt aware that RX's are at front for pick up.Reynaldo Minium CMA  July 20, 2009 9:33 AM     Prescriptions: AMPHETAMINE-DEXTROAMPHETAMINE 30 MG XR24H-CAP (AMPHETAMINE-DEXTROAMPHETAMINE) 1 three times a day as needed  #90 x 0   Entered by:   Randell Loop CMA   Authorized by:   Waymon Budge MD   Signed by:   Randell Loop CMA on 07/20/2009   Method used:   Print then Give to Patient   RxID:   [id number] AMPHETAMINE-DEXTROAMPHETAMINE 10 MG TABS (AMPHETAMINE-DEXTROAMPHETAMINE) up to 4 daily as needed  #120 x 0   Entered by:   Randell Loop CMA   Authorized by:   Waymon Budge MD   Signed by:   Randell Loop CMA on 07/20/2009   Method used:   Print then Give to Patient   RxID:   [id number]

## 2010-02-02 NOTE — Assessment & Plan Note (Signed)
Summary: 12 months/apc   Copy to:  Ronny Flurry Primary Provider/Referring Provider:  Adair Laundry bill  CC:  Yearly follow up visit-hypersomnia.  History of Present Illness: 08/14/08- Hypersomnia, snoring (AHI 0/hr) She says methylphenidate has been "wonderfull"= sleeping well and denying headache, palpitation, chest pain, mood swings. She is taking a 20 plus a 10 mg and asks for a 30 mg tab. We discussed Adderall and alternative dosing forms.   March 05, 2009- Hypersomnia, snoring (AHI 0/hr) Falling asleep and snoring at work. Sleeps soundly at night. Not taking sleep meds. Taking alprazolam 3-4 daily of 0.5mg  tabs. She thinks she could get by with less. Taking methylphenidate 10 + 20 mg in AM and again at lunch. Denies headache, palpitation, chest pain or mood swings.  Taking hydrocodone pain med twice daily. Thryoid function is being supervised. Bedtime 9-10PM, up 430-5AM. No deliberate naps.  September 15, 2009- Hypersomnia, snoring (AHI 0/hr) She had initiated a request for a letter from [place] to her employer to inform them that she was being cared for for sleep problems. Employer terminated her in August "for not meeting goals" because their number one goal was that she remain alert at work. She has retained an attorney. She feels meds are working great and doesn't want to change. Asks refill meds for another month now  Preventive Screening-Counseling & Management  Alcohol-Tobacco     Smoking Status: current     Packs/Day: 1/2 - 3/4     Year Started: 1984  Current Medications (verified): 1)  Levoxyl 150 Mcg Tabs (Levothyroxine Sodium) .... Take 1 By Mouth Once Daily 2)  Paroxetine Hcl 20 Mg Tabs (Paroxetine Hcl) .... Take 1 Tablet By Mouth Once A Day 3)  Alprazolam 0.5 Mg Tabs (Alprazolam) .... Take 1 Tablet By Mouth Four Times A Day As Needed 4)  Hydrocodone-Acetaminophen 10-325 Mg Tabs (Hydrocodone-Acetaminophen) .... Take 1 Tablet By Mouth Three Times A Day As Needed 5)   Methocarbamol 750 Mg Tabs (Methocarbamol) .... Take 1 Tablet By Mouth Three Times A Day As Needed 6)  Multivitamins  Tabs (Multiple Vitamin) .... Take 1 By Mouth Once Daily 7)  Amphetamine-Dextroamphetamine 10 Mg Tabs (Amphetamine-Dextroamphetamine) .... Up To 4 Daily As Needed 8)  Amphetamine-Dextroamphetamine 30 Mg Xr24h-Cap (Amphetamine-Dextroamphetamine) .Marland Kitchen.. 1 Three Times A Day As Needed  Allergies (verified): 1)  ! Phenergan  Past History:  Past Medical History: Last updated: 05/16/2008 Hypothyroid- Dr Evlyn Kanner  Hypersomnia- NPSG AHI 0/hr  Past Surgical History: Last updated: 04/21/2008 Nasal septoplasty Sinus surgery Tubal ligation  Family History: Last updated: 04/21/2008 father - emphysema strong history of colon cancer in materal aunts/uncles - 6 of 12 have been diagnosed.  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    Vital  Signs:  Patient profile:   55 year old female Height:      64 inches Weight:      203.13 pounds BMI:     34.99 O2 Sat:      97 % on Room air Pulse rate:   91 /  minute BP sitting:   122 / 82  (left arm) Cuff size:   regular  Vitals Entered By: Reynaldo Minium CMA (September 15, 2009 10:40 AM)  O2 Flow:  Room air CC: Yearly follow up visit-hypersomnia   Physical Exam  Additional Exam:  General: A/Ox3; pleasant and cooperative, NAD, mild overweight  calm, responsive SKIN: no rash, lesions NODES: no lymphadenopathy HEENT: [place]/AT, EOM- WNL, Conjuctivae- clear, PERRLA, TM-WNL, Nose- clear, Throat- clear and wnl, Mallampati  II-III NECK: Supple w/ fair ROM, JVD- none, normal carotid impulses w/o bruits Thyroid-  CHEST: Clear to P&A HEART: RRR, no m/g/r heard ABDOMEN: overweight GUY:QIHK, nl pulses, no edema  NEURO: Grossly intact to observation      Impression & Recommendations:  Problem # 1:  HYPERSOMNIA, 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 She has not had cataplexy or sleep paralysis, so I favor ideopathic hypersomnia. We would need to get her off of meds to get a MSLT for further documentation. I have not had reason to feel she has had poor sleep hygieine or other reason for her sustained problem with hypersomnia.  I can refill her meds, which have been effective and very well tolerated for the most part. These issues were reviewed with her.  Medications Added to Medication List This Visit: 1)  Levoxyl 150 Mcg Tabs (Levothyroxine sodium) .... Take 1 by mouth once daily  Other Orders: Est. Patient Level III (74259)  Patient Instructions: 1)  Please schedule a follow-up appointment in 1 year. 2)  Refill scripts for meds Prescriptions: AMPHETAMINE-DEXTROAMPHETAMINE 30 MG XR24H-CAP (AMPHETAMINE-DEXTROAMPHETAMINE) 1 three times a day as needed  #90 x 0   Entered and Authorized by:   Waymon Budge MD   Signed by:   Waymon Budge MD on 09/15/2009   Method used:   Print then Give to Patient   RxID:    [id number] AMPHETAMINE-DEXTROAMPHETAMINE 10 MG TABS (AMPHETAMINE-DEXTROAMPHETAMINE) up to 4 daily as needed  #120 x 0   Entered and Authorized by:   Waymon Budge MD   Signed by:   Waymon Budge MD on 09/15/2009   Method used:   Print then Give to Patient   RxID:   [id number]

## 2010-02-02 NOTE — Progress Notes (Signed)
Summary: rx  Phone Note Call from Patient Call back at [phone]   Caller: Patient Call For: young Reason for Call: Refill Medication Summary of Call: Need written rx for amphetamine-detroamphetamine 30mg  (90-day) supply. Initial call taken by: Darletta Moll,  November 10, 2009 1:18 PM  Follow-up for Phone Call        Need to know if she wants this rx to be mailed or picked up [facility]- Vernie Murders  November 10, 2009 2:37 PM   Patient calling back.  She would like to pick up rx.  Thanks. Lehman Prom  November 10, 2009 4:04 PM  Follow-up by: Lehman Prom,  November 10, 2009 4:04 PM  Additional Follow-up for Phone Call Additional follow up Details #1::        Last OV: 9.11.11 Pending OV: 9.11.12  Rx printed and place on cart.   Additional Follow-up by: Gweneth Dimitri RN,  November 10, 2009 4:13 PM    Additional Follow-up for Phone Call Additional follow up Details #2::    Rx is at for pick up; Left message on voicemail on number given to call.Reynaldo Minium CMA  November 11, 2009 9:02 AM   Prescriptions: AMPHETAMINE-DEXTROAMPHETAMINE 30 MG XR24H-CAP (AMPHETAMINE-DEXTROAMPHETAMINE) 1 three times a day as needed  #90 x 0   Entered by:   Gweneth Dimitri RN   Authorized by:   Waymon Budge MD   Signed by:   Gweneth Dimitri RN on 11/10/2009   Method used:   Print then Give to Patient   RxID:   [id number]

## 2010-02-02 NOTE — Medication Information (Signed)
Summary: Prior Auth for Amphetamine Salt/[employer]  Prior Auth for Amphetamine Salt/[employer]   Imported By: Sherian Rein 03/18/2009 14:08:16  _____________________________________________________________________  External Attachment:    Type:   Image     Comment:   External Document

## 2010-02-02 NOTE — Progress Notes (Signed)
Summary: PRESCRIPT  Phone Note Call from Patient Call back at [phone]   Caller: Patient Call For: young Summary of Call: pt is at the pharmacy . she picked up prescript from dr young today for adderrall regular and the pharmacy only carry 15mg  ER. [facility] [address] [phone]. Initial call taken by: Rickard Patience,  October 22, 2009 1:21 PM  Follow-up for Phone Call        Spoke with Judie Grieve at [facility] [address]-no pharmacy will have any Regular strength Adderal as they are back order until possbily January. Please advise what else the patient can take in its place. Thanks.Reynaldo Minium CMA  October 22, 2009 2:11 PM   Additional Follow-up for Phone Call Additional follow up Details #1::        Spoke with patient-she is aware of the manufactor's backorder on regular strength Adderall and she will need to make her ER strength last until then. Pt states she understands and will continue to look for a pharmacy that will have regular strength Adderall-she has RX on hand in case.Reynaldo Minium CMA  October 23, 2009 3:42 PM

## 2010-02-02 NOTE — Progress Notes (Signed)
Summary: prior auth needed for rx's   Phone Note Call from Patient   Caller: Patient Call For: Crystal Kirby Summary of Call: pt states that [facility] asked her to call hear and have nurse call [employer] re: new rx's (given to pt on 03/05/2009 ov) AMPHETAMINE-DEXROAMPHETAMINE- 2 STRENGTHS. needs prior auth. pt # [id number] Initial call taken by: Tivis Ringer, CNA,  March 09, 2009 10:05 AM  Follow-up for Phone Call        spoke with [employer] and there is already a PA initiated on 03/05/09 They faxed forms to be completed on 03/05/09 as well.  they have not received completed forms.  I requested they refax forms in case I cannot locate forms. [name] CMA  March 09, 2009 4:31 PM Spoke with [name] about forms, she states he had a copy to complete, she will ask CY where the forms are and let me know. [name] CMA  March 09, 2009 4:34 PM  PA initiated and faxed. [name] CMA  March 10, 2009 9:06 AM

## 2010-02-02 NOTE — Progress Notes (Signed)
Summary: rx add ritalin 10 mg CR to her current 20 mg script  Phone Note Call from Patient Call back at [phone]   Caller: Patient Call For: Crystal Kirby Reason for Call: Talk to Nurse, Talk to Doctor Summary of Call: Methylphenidate CR 20mg  - working well, but still having occasional issues.  Is there a little stronger dose, can you increase a little.  Has had 4 occassions at work and this is not acceptable.  Pt will pick up.  Please call when ready. Initial call taken by: Eugene Gavia,  June 18, 2008 9:46 AM  Follow-up for Phone Call        CDY; please advise if you would like to move pt up on dosing before I print script. Thanks. Reynaldo Minium CMA  June 18, 2008 9:49 AM   Additional Follow-up for Phone Call Additional follow up Details #1::        I have added methylphenidate/ritalin CR 10 mg to her list for use as supplement as needed, beyond her basic 20 mg script. Please print script for my signature adn see if she wants mail or pickup. Additional Follow-up by: Waymon Budge MD,  June 18, 2008 8:19 PM    Additional Follow-up for Phone Call Additional follow up Details #2::    [facility] for pt TCB to see if she wants to pick up rx or mail.  RX is at [employer] desk to sign  Abigail Miyamoto RN  June 19, 2008 9:24 AM  Follow-up by: Abigail Miyamoto RN,  June 19, 2008 9:24 AM  Additional Follow-up for Phone Call Additional follow up Details #3:: Details for Additional Follow-up Action Taken: called and left message on machine to make pt aware that the rx is up front and ready to be picked up. Marijo File CMA  June 19, 2008 10:10 AM   New/Updated Medications: METHYLPHENIDATE HCL 10 MG TABS (METHYLPHENIDATE HCL) 1 two times a day as needed supplement METHYLPHENIDATE HCL 10 MG TABS (METHYLPHENIDATE HCL) 1 two times a day as needed supplement   Prescriptions: METHYLPHENIDATE HCL 10 MG TABS (METHYLPHENIDATE HCL) 1 two times a day as needed supplement  #30 x 0   Entered by:   Abigail Miyamoto RN  Authorized by:   Waymon Budge MD   Signed by:   Abigail Miyamoto RN on 06/19/2008   Method used:   Print then Give to Patient   RxID:   [id number] METHYLPHENIDATE HCL 10 MG TABS (METHYLPHENIDATE HCL) 1 two times a day as needed supplement  #30 x 0   Entered and Authorized by:   Waymon Budge MD   Signed by:   Waymon Budge MD on 06/18/2008   Method used:   Historical   RxID:   [id number]

## 2010-02-04 NOTE — Progress Notes (Signed)
Summary: written presctiptions  Phone Note Call from Patient   Caller: Patient Call For: DR. YOUNG Summary of Call: Patient phoned and stated that she will be in the area tomorrow and would like to know if she can pick up a written prescription Adderall 30 MG extended release #90 and Adderall 10 MG #120. Patient states that she knows she cant get them filled for another week or week and a half. Patient can be reached at [phone] Initial call taken by: Vedia Coffer,  January 19, 2010 3:29 PM  Follow-up for Phone Call        Pt aware Rx is at front for pick up .Reynaldo Minium CMA  January 19, 2010 5:16 PM     Prescriptions: AMPHETAMINE-DEXTROAMPHETAMINE 30 MG XR24H-CAP (AMPHETAMINE-DEXTROAMPHETAMINE) 1 three times a day as needed  #90 x 0   Entered by:   Vernie Murders   Authorized by:   Waymon Budge MD   Signed by:   Vernie Murders on 01/19/2010   Method used:   Print then Give to Patient   RxID:   [id number] AMPHETAMINE-DEXTROAMPHETAMINE 10 MG TABS (AMPHETAMINE-DEXTROAMPHETAMINE) 4 daily as needed  #120 x 0   Entered by:   Vernie Murders   Authorized by:   Waymon Budge MD   Signed by:   Vernie Murders on 01/19/2010   Method used:   Print then Give to Patient   RxID:   [id number]

## 2010-02-04 NOTE — Progress Notes (Signed)
Summary: call pt when rx is ready  Phone Note Call from Patient Call back at Home Phone [phone]   Caller: Patient Call For: young Summary of Call: written presc for amphetamin salt 30 mg extended release generic 90 and amphetamin salt 10mg  tab 120 tabs will pick up rx  Initial call taken by: Lacinda Axon,  December 30, 2009 1:01 PM  Follow-up for Phone Call        Rx's printed for CDY to sign. Will call patient once ready for pick up.Reynaldo Minium CMA  December 30, 2009 1:56 PM   Spoke with pt and advised rxs are ready to be picked up. Follow-up by: Vernie Murders,  December 30, 2009 2:26 PM    Prescriptions: AMPHETAMINE-DEXTROAMPHETAMINE 10 MG TABS (AMPHETAMINE-DEXTROAMPHETAMINE) 4 daily as needed  #120 x 0   Entered by:   Reynaldo Minium CMA   Authorized by:   Waymon Budge MD   Signed by:   Reynaldo Minium CMA on 12/30/2009   Method used:   Print then Give to Patient   RxID:   [id number] AMPHETAMINE-DEXTROAMPHETAMINE 30 MG XR24H-CAP (AMPHETAMINE-DEXTROAMPHETAMINE) 1 three times a day as needed  #90 x 0   Entered by:   Reynaldo Minium CMA   Authorized by:   Waymon Budge MD   Signed by:   Reynaldo Minium CMA on 12/30/2009   Method used:   Print then Give to Patient   RxID:   [id number]

## 2010-02-23 ENCOUNTER — Telehealth (INDEPENDENT_AMBULATORY_CARE_PROVIDER_SITE_OTHER): Payer: Self-pay | Admitting: *Deleted

## 2010-02-24 ENCOUNTER — Telehealth (INDEPENDENT_AMBULATORY_CARE_PROVIDER_SITE_OTHER): Payer: Self-pay | Admitting: *Deleted

## 2010-02-26 ENCOUNTER — Encounter: Payer: Self-pay | Admitting: Internal Medicine

## 2010-03-02 NOTE — Progress Notes (Signed)
Summary: refills  Phone Note Call from Patient Call back at Home Phone [phone]   Caller: Patient Call For: young Reason for Call: Refill Medication Summary of Call: Requests rxs for amphetamine-dextroamphetamine 30mg  (90-day) and amphetamine-dextroamphetamine 10mg  (120-day) will pick up. Initial call taken by: Darletta Moll,  February 23, 2010 12:17 PM  Follow-up for Phone Call        Rxs placed on CDY's cart to be sign Vernie Murders  February 23, 2010 12:26 PM   Additional Follow-up for Phone Call Additional follow up Details #1::        Spoke with pt-aware that RX is at front for pick up.Reynaldo Minium CMA  February 23, 2010 3:35 PM     Prescriptions: AMPHETAMINE-DEXTROAMPHETAMINE 30 MG XR24H-CAP (AMPHETAMINE-DEXTROAMPHETAMINE) 1 three times a day as needed  #90 x 0   Entered by:   Vernie Murders   Authorized by:   Waymon Budge MD   Signed by:   Vernie Murders on 02/23/2010   Method used:   Print then Give to Patient   RxID:   [id number] AMPHETAMINE-DEXTROAMPHETAMINE 10 MG TABS (AMPHETAMINE-DEXTROAMPHETAMINE) 4 daily as needed  #120 x 0   Entered by:   Vernie Murders   Authorized by:   Waymon Budge MD   Signed by:   Vernie Murders on 02/23/2010   Method used:   Print then Give to Patient   RxID:   [id number]

## 2010-03-09 ENCOUNTER — Encounter: Payer: Self-pay | Admitting: Internal Medicine

## 2010-03-10 ENCOUNTER — Telehealth (INDEPENDENT_AMBULATORY_CARE_PROVIDER_SITE_OTHER): Payer: Self-pay | Admitting: *Deleted

## 2010-03-11 NOTE — Medication Information (Signed)
Summary: Dextroamphetamine approved/[employer]  Dextroamphetamine approved/[employer]   Imported By: Sherian Rein 03/04/2010 07:46:02  _____________________________________________________________________  External Attachment:    Type:   Image     Comment:   External Document

## 2010-03-16 NOTE — Progress Notes (Signed)
Summary: Adderall Approved - pt requesting 3 mo rx  Phone Note From Pharmacy   Caller: Natalia Leatherwood with [facility] Call For: young  Summary of Call: katherine with [facility] phoned she stated that Ms [facility] authorization for her Adderall and her Adderall XR expires on 03/10/2010 and they are faxing over the questionaire for the new authorization so that she does not have a laspe. If you have any questions regarding the fax please call [phone] and any rep can help.  Initial call taken by: Vedia Coffer,  February 24, 2010 4:44 PM  Follow-up for Phone Call        will forward message to [facility] so she will know to look for this form from [facility] for CY to fill out. Randell Loop [facility]  February 24, 2010 4:52 PM   received form and put on CY's cart to fill out.  Aundra Millet Reynolds LPN  February 24, 2010 5:19 PM   Additional Follow-up for Phone Call Additional follow up Details #1::        Papers have been filled out by [facility] and faxed back to [facility]; papers are in Triage PA waiting approval box.Reynaldo Minium CMA  February 26, 2010 8:56 AM     Additional Follow-up for Phone Call Additional follow up Details #2::    Patient calling needing rx for adderall--[facility]. Lehman Prom  March 03, 2010 12:17 PM  Per [facility], Adderall has been denied. Case # [id number]. Per recording a fax has been sent to Dr. Maple Hudson so he may appeal if he wishes. Katie, have you received this? Please advise.Michel Bickers CMA  March 09, 2010 11:45 AM   Looks as though the med has been approved; please call to make sure the strength and med in approval letter above this phone note is correct.Reynaldo Minium CMA  March 09, 2010 12:26 PM   Additional Follow-up for Phone Call Additional follow up Details #3:: Details for Additional Follow-up Action Taken: [employer], spoke with Thayer Ohm.  Per Thayer Ohm, the case number provided above is an old case number.  We should disregard it.    Adderall 30mg  has been approved 02/05/10-02/26/2011.  Case #  on this med is [id number] Adderall 10mg  also approved 02/16/10-03/09/2011.  Case # [id number]  Called, spoke with pt.  She is aware adderall 10 and 30 mg approved.  She verbalized understanding and requesting a 90 day supply of both meds to be sent to [facility].   Adderall 10mg  #120 x 0 last given on 02/23/10 Adderall 30mg  #90 x 0 last given on 02/23/10 Dr. Maple Hudson, pls advise if 3 month supply rx ok for both of this meds.  Thanks! Additional Follow-up by: Gweneth Dimitri RN,  March 09, 2010 2:16 PM  Prescriptions: AMPHETAMINE-DEXTROAMPHETAMINE 30 MG XR24H-CAP (AMPHETAMINE-DEXTROAMPHETAMINE) 1 three times a day as needed  #270 x 0   Entered by:   Gweneth Dimitri RN   Authorized by:   Waymon Budge MD   Signed by:   Gweneth Dimitri RN on 03/09/2010   Method used:   Printed then faxed to ...       [facility] (retail)             ,          Ph: [phone]       Fax: [phone]   RxID:   [id number] AMPHETAMINE-DEXTROAMPHETAMINE 10 MG TABS (AMPHETAMINE-DEXTROAMPHETAMINE) 4 daily as needed  #360 x 0   Entered by:   Gweneth Dimitri RN   Authorized  by:   Waymon Budge MD   Signed by:   Gweneth Dimitri RN on 03/09/2010   Method used:   Printed then faxed to ...       [facility] (retail)             ,          Ph: [phone]       Fax: [phone]   RxID:   [id number]  Per CDY-okay to refill as requested.Reynaldo Minium CMA  March 09, 2010 2:31 PM   Rxs printed and placed on CDY's cart for signature then will fax to [facility].       I have faxed the RX's to [facility] at patients request.Katie [facility] CMA  March 09, 2010 5:01 PM

## 2010-03-16 NOTE — Progress Notes (Signed)
Summary: adderall rx mailed to [facility]  Phone Note Call from Patient Call back at Home Phone [phone]   Caller: Patient Call For: young Summary of Call: pt says that [facility] is requesting that "the original rx's" for adderall (both strengths) be mailed to: [facility] [id number], [place] [address]. caller says if nurse can send 4 original rx's for 1 yr. this would be best. also wants to know if this can be mailed "overnight express"?  Initial call taken by: Tivis Ringer, CNA,  March 10, 2010 11:24 AM  Follow-up for Phone Call        [facility] Vernie Murders  March 10, 2010 11:46 AM  Pt returned call.  She is requesting we mail a copy of the orginal rxs to above address bc she states [facility] will not accept the faxed ones d/t the type of medication these are.  Also, would like to know if CY will send 2 or 4 different rxs for each adderall so she does not have to call back every 3 months to do this process.  I advised I did not know if we could do this considering it is a controlled med but will ask Dr. Maple Hudson.  She is understanding of this.  Dr. Maple Hudson, pls advise if you would like to send more than 1 rx at a time.  Thanks!  ** Pt requesting rxs be mailed today. Follow-up by: Gweneth Dimitri RN,  March 10, 2010 12:40 PM  Additional Follow-up for Phone Call Additional follow up Details #1::        No- for controlled med i will not pre-date or etc. She will need to call each time. I have to [facility] the documentation, including scheduled visits, to show that this med is being given under medical supervision.  It is ok to generate a new set for her to mail to [facility]. She is welcome to come and pick them up herself.  Additional Follow-up by: Waymon Budge MD,  March 10, 2010 1:02 PM    Additional Follow-up for Phone Call Additional follow up Details #2::    [facility] Vernie Murders  March 10, 2010 2:01 PM  Pt returned call.  She is aware she will have to call each time for  adderall rxs to be mailed to [facility] and verbalized understanding of this.  She is also aware adderall rxs will be mailed today.  They were printed again as I cannot find the ones that were faxed yesterday and placed on CY's cart for signature. Gweneth Dimitri RN  March 10, 2010 3:41 PM  rx's signed by CDY and placed in the mail. Boone Master CNA/MA  March 10, 2010 3:54 PM   Prescriptions: AMPHETAMINE-DEXTROAMPHETAMINE 30 MG XR24H-CAP (AMPHETAMINE-DEXTROAMPHETAMINE) 1 three times a day as needed  #270 x 0   Entered by:   Gweneth Dimitri RN   Authorized by:   Waymon Budge MD   Signed by:   Gweneth Dimitri RN on 03/10/2010   Method used:   Printed then mailed to ...       [facility])             , [place]         Ph: [phone]       Fax: [phone]   RxID:   [id number] AMPHETAMINE-DEXTROAMPHETAMINE 10 MG TABS (AMPHETAMINE-DEXTROAMPHETAMINE) 4 daily as needed  #360 x 0   Entered by:   Gweneth Dimitri RN  Authorized by:   Waymon Budge MD   Signed by:   Gweneth Dimitri RN on 03/10/2010   Method used:   Printed then mailed to ...       [facility] ([facility])             , [place]         Ph: [phone]       Fax: [phone]   RxID:   [id number]

## 2010-03-23 NOTE — Medication Information (Signed)
Summary: Amphetamine approved/[facility]  Amphetamine approved/[facility]   Imported By: Sherian Rein 03/16/2010 07:18:31  _____________________________________________________________________  External Attachment:    Type:   Image     Comment:   External Document

## 2010-04-27 ENCOUNTER — Other Ambulatory Visit: Payer: Self-pay | Admitting: Cardiology

## 2010-04-27 DIAGNOSIS — F329 Major depressive disorder, single episode, unspecified: Secondary | ICD-10-CM

## 2010-04-28 NOTE — Telephone Encounter (Signed)
escribe request  

## 2010-05-18 NOTE — Assessment & Plan Note (Signed)
Ms. Meara returns to the clinic today for followup evaluation.  She  reports that overall she has had increased back pain, but she has been  more active recently.  She also has to help her in-laws move out of an  apartment into a home in the near future, and then that is going to be  in [place].  They are having movers move the heavy furniture, but  she will be responsible for moving some of the numerous items that they  have.  She has been using her hydrocodone 5 and sometimes 6 times per  day.  She would like to have an increase, if possible.  She also uses  Skelaxin generally at night and that seems to help.  She is using  Chantix and is down to 3 cigarettes per day.  She tries to walk on a  regular basis approximately 30 minutes a day.   MEDICATIONS:  1. Levoxyl 137 mcg p.o. daily.  2. Centrum Silver daily.  3. Skelaxin 800 mg t.i.d. p.r.n.  4. Alprazolam 0.25 mg t.i.d. p.r.n.  5. Hydrocodone 5/325 one tablet 6 times per day p.r.n.   REVIEW OF SYSTEMS:  Positive for weight gain and night sweats.   PHYSICAL EXAMINATION:  GENERAL:  Well-appearing, fit, adult female in  mild-to-moderate acute discomfort.  VITAL SIGNS:  Blood pressure 113/79 with a pulse of 67, respiratory rate  18, and O2 saturation 99% on room air.  EXTREMITIES:  She has 5/5 strength throughout the bilateral upper and  lower extremities.  She ambulates without any assistive device.   IMPRESSION:  Persistent low/mid back with mild degenerative disk disease  per MRI study.   In the office today, we did refill the patient's hydrocodone as of  October 05, 2007, and increased her use up to 6 times per day on an as-  needed basis.  She will continue using the Skelaxin and has sufficient  supply at present.  We will plan on seeing her in followup in  approximately 3-4 months' time with refills prior to that appointment as  necessary.           ______________________________  Ellwood Dense, M.D.     DC/MedQ  D:  10/03/2007 09:13:45  T:  10/03/2007 23:19:48  Job #:  [id number]

## 2010-05-18 NOTE — Assessment & Plan Note (Signed)
[name] returns to clinic today for followup evaluation.  She is  somewhat depressed about a recent loss of a good friend recently.  The  good friend apparently had a lot of healthcare problems along with some  addiction problems.  She apparently was attending Alcoholics Anonymous,  but also had a problem with prescription medication and possibly street  drugs.   [name] herself reports that she has been using her hydrocodone  up to 6 per day.  She reports that she did try Chantix and down to 3  cigarettes per day, but did not feel that she could come off completely  and she has put the Chantix away for a while until she gets  psychologically able to proceed with weaning of the tobacco usage.  She  does need a refill on her hydrocodone in the office today.   The patient reports that her pain is interfering with her traveling,  social life, sleeping, standing, walking, sitting, lifting, and  homemaking   MEDICATIONS:  1. Levoxyl 137 mcg p.o. daily.  2. Centrum Silver daily.  3. Skelaxin 800 mg t.i.d. p.r.n.  4. Alprazolam 0.25 mg t.i.d. p.r.n.  5. Hydrocodone 5/325 one tablet 6 times per day p.r.n.   REVIEW OF SYSTEMS:  Noncontributory.   PHYSICAL EXAMINATION:  A well-appearing fit adult female in mild to no  acute discomfort.  Blood pressure is 122/79 with pulse 73, respiratory  rate 18, and O2 saturation 97% on room air.  She has 5-/5 strength  throughout the bilateral upper and lower extremities.  She ambulates  without any assistive device.   IMPRESSION:  Persistent low/mid back pain with mild degenerative disk  disease per MRI study.   In the office today, we did refill the patient's hydrocodone as of  today, January 23, 2008.  We will plan on seeing her in followup in  approximately 3 months' time with refills prior to that appointment as  necessary.           ______________________________  Ellwood Dense, M.D.     DC/MedQ  D:  01/23/2008 09:42:40   T:  01/23/2008 21:03:02  Job #:  [id number]

## 2010-05-18 NOTE — Assessment & Plan Note (Signed)
Crystal Kirby was last seen by me on May 12, 2008.  She underwent right  L5 dorsal ramus injection, right L4 medial branch block, right L3 medial  branch block under fluoroscopic guidance.  She went from a 7/10 pain  preinjection to post injection pain of 0/10, which lasted for about 2  weeks.  She noticed some more left-sided pain after her right side felt  better.   INTERVAL HISTORY:  She has had a sleep study and has been started on  Ritalin since that time.  I do not have the printout of that.   Her pain is since 7/10 range, interferes with activity, has 6/10.  Sleep  is fair.  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   REVIEW OF SYSTEMS:  Positive for spasms, depression.   Other physicians have record, Dr. Maple Hudson from Pulmonary.   PHYSICAL EXAMINATION:  VITAL SIGNS:  Her blood pressure is 105/71, pulse  68, respirations 18, O2 sat 97% on room air.  GENERAL:  Well-developed, well-nourished female in no acute distress.  Orientation x3.  Affect bright and alert.  Gait is normal.  BACK:  Range of motion is reduced in extension, but good in flexion.  EXTREMITIES:  Upper and lower extremity strength are normal.  Gait is  normal.   IMPRESSION:  Cervical spondylosis without myelopathy.  She has evidence  of lumbar spondylolisthesis, which is attributing to facet arthropathy.   PLAN:  We will set her up for bilateral medial branch blocks and if  another positive response, short-term i.e. less than 6 weeks, we will  set her up with radiofrequency neurotomy, worse on the right side.  I  have discussed this plan with her, she agrees.  I was able on each chart  to look for nocturnal polysomnogram, which was performed May 09, 2008.  No significant sleep apnea noted.      Erick Colace, M.D.  Electronically Signed     AEK/MedQ  D:  07/15/2008 16:09:59  T:  07/16/2008 07:42:58  Job #:  [id number]

## 2010-05-18 NOTE — Procedures (Signed)
NAME:  Crystal Kirby, Crystal Kirby           ACCOUNT NO.:  [id number]   MEDICAL RECORD NO.:  [id number]          PATIENT TYPE:  OUT   LOCATION:  SLEEP CENTER                 FACILITY:  [facility]   PHYSICIAN:  Clinton D. Maple Hudson, MD, FCCP, FACPDATE OF BIRTH:  09/[birth year]1953   DATE OF STUDY:  05/09/2008                            NOCTURNAL POLYSOMNOGRAM   REFERRING PHYSICIAN:  Clinton D. Young, MD, FCCP, FACP   INDICATION FOR STUDY:  Hypersomnia with sleep apnea.   EPWORTH SLEEPINESS SCORE:  Epworth sleepiness score 11/24.  BMI 34.3.  Weight 200 pounds.  Height 64 inches.  Neck 15 inches.   MEDICATIONS:  Home medications charted and reviewed.   SLEEP ARCHITECTURE:  Total sleep time 399 minutes with sleep efficiency  85.3%.  Stage I was 11.6%, stage II 77.2%, stage III absent.  REM 11.1%  of total sleep time.  Sleep latency 31 minutes.  REM latency 267  minutes.  Awake after sleep onset 41 minutes.  Arousal index 13.4.  No  bedtime medication was taken.   RESPIRATORY DATA:  Apnea-hypopnea index (AHI) 0 per hour.  No events  meeting standard scoring criteria were noted.  A 17 respiratory events  related to arousal, but not meeting duration and oxygen desaturation  criteria for definition as apneas or hypopneas were noted for an RDI  (respiratory disturbance index) of 2.6 per hour.   OXYGEN DATA:  Moderate snoring with oxygen desaturation to a nadir of  90%.  Mean oxygen saturation was 93.3% on room air through the study.   CARDIAC DATA:  Sinus rhythm with occasional PAC.   MOVEMENT/PARASOMNIA:  No significant movement disturbance.  Bathroom x2.   IMPRESSION/RECOMMENDATIONS:  1. No significant respiratory disturbance causing sleep disturbance or      arousal, apnea-hypopnea index 0 per hour.  Moderate snoring with      oxygen desaturation to a nadir of 90% on room air.  2. The patient stated she assumed she needed to be especially tired to      snore as loudly as her husband noted at home.      Clinton D. Maple Hudson, MD, [facility], FACP  Diplomate, Biomedical engineer of Sleep Medicine  Electronically Signed     CDY/MEDQ  D:  05/09/2008 20:41:50  T:  05/10/2008 06:45:21  Job:  [id number]

## 2010-05-18 NOTE — Assessment & Plan Note (Signed)
Crystal Kirby returns to clinic today for followup evaluation.  She  reports that overall she is doing fairly well although she did fall in  her backyard when she was trying to refill a bird feeder.  She fell and  apparently fell off a stepladder but did not suffer any particular  injury other than bruising herself, especially in her back.  She has  been using her hydrocodone, getting reasonable relief, approximately 5  times per day.  She does need a refill on her hydrocodone as she and her  husband are planning to go to [place] to visit their sons who are  playing in college baseball games.   MEDICATIONS:  1. Levoxyl 137 mcg p.o. daily.  2. Centrum Silver daily.  3. Skelaxin 800 mg t.i.d. p.r.n.  4. Alprazolam 0.25 mg t.i.d. p.r.n.  5. Hydrocodone 5/325 one tablet 5 times per day p.r.n.  6. Metronidazole cream 0.75% p.r.n.   REVIEW OF SYSTEMS:  Positive for night sweats.   PHYSICAL EXAM:  Well-appearing adult female in mild to no acute  discomfort.  Vitals were not obtained in the office today.  She  ambulates without any assisted device.  She has 5-/5 strength throughout  the bilateral upper and lower extremities.   IMPRESSION:  Persistent low/mid back pain with mild degenerative disk  disease per MRI study.   In the office today, we did refill the patient's hydrocodone a total of  150 as of today.  We will plan on seeing the patient in followup in  approximately 4 months' time.  She continues to get good analgesic  effect without signs of significant side effects.           ______________________________  Ellwood Dense, M.D.     DC/MedQ  D:  02/08/2007 10:10:26  T:  02/09/2007 09:27:44  Job #:  [id number]

## 2010-05-18 NOTE — Letter (Signed)
December 03, 2008    To Whom it May Concern   RE:  Crystal, Kirby  MRN:  [id number]  /  DOB:  January 16, 1955   [name]  is under my medical care for sleep disorders,  associated with significant daytime sleepiness, including obstructive  sleep apnea and idiopathic hypersomnia.  These are chronic problems  which can be treated but not cured.   If further information is required.  Please let us know.  For now, I  expect to continue working with her on a long-term basis with occasional  office visits.    Sincerely,      Clinton D. Maple Hudson, MD, Tonny Bollman, FACP  Electronically Signed    CDY/MedQ  DD: 12/03/2008  DT: 12/03/2008  Job #: [id number]

## 2010-05-18 NOTE — Assessment & Plan Note (Signed)
Crystal Kirby returns to clinic today for followup evaluation.  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  She reports that she has had flare-up in her  back pain a couple of weeks ago and has had to use slightly increased  amounts of pain medicine.  She continue to try to walk on a regular  basis but has been slightly less able recently because of the stress in  her life.  She continues to use Skelaxin but reports only minimal  relief.  She does use the hydrocodone generally 4 times per day.  She  does need a refill on that over the next couple of days.   MEDICATIONS:  1. Levoxyl 137 mcg daily.  2. Centrum Silver daily.  3. Skelaxin 800 mg t.i.d. p.r.n.  4. Alprazolam 0.25 mg t.i.d. p.r.n.  5. Hydrocodone 5/325 one tablet q.i.d. p.r.n.  6. Metronidazole cream 0.75% p.r.n.   REVIEW OF SYSTEMS:  Positive for night sweats.   PHYSICAL EXAMINATION:  Well-appearing fit adult female in mild to no  acute discomfort.  Blood pressure 93/61 with pulse of 81, respiratory  rate 18, O2 saturation 100% on room air.  She has 5- over 5 strength  throughout.   IMPRESSION:  Persistent mid/low back pain with mild degenerative disk  disease per MRI study.   In the office today we did refill the patient's hydrocodone a total of  120 as of August 02, 2006.  She reports that she wants to return to a one  month supply instead of waiting for the long delay related to getting a  3 month supply by mail.  We will plan on seeing the patient in followup  in this office in approximately 2-3 month's time with refills prior to  that appointment as necessary.           ______________________________  Ellwood Dense, M.D.     DC/MedQ  D:  07/31/2006 11:01:07  T:  07/31/2006 17:43:15  Job #:  [id number]

## 2010-05-18 NOTE — Procedures (Signed)
NAMESTELA, Crystal Kirby           ACCOUNT NO.:  [id number]   MEDICAL RECORD NO.:  [id number]          PATIENT TYPE:  OUT   LOCATION:  XRAY                         FACILITY:  [facility]   PHYSICIAN:  Erick Colace, M.D.DATE OF BIRTH:  [birth year]   DATE OF PROCEDURE:  05/12/2008  DATE OF DISCHARGE:  04/30/2008                               OPERATIVE REPORT   This is a right L5 dorsal ramus injection, right L4 medial branch block,  right L3 medial branch block under fluoroscopic guidance.   INDICATIONS:  Lumbar pain with facet arthropathy noted on x-rays.  Pain  is only partially responsive to medication management including narcotic  analgesics and interferes with self-care mobility.   Informed consent was obtained after describing risks and benefits of the  procedure with the patient.  These include bleeding, bruising,  infection.  She elects to proceed and has given written consent.  The  patient placed prone on fluoroscopy table.  Betadine prep, sterile  drape, 25-gauge inch and half needle was used to anesthetize the skin  and subcutaneous tissue 1% lidocaine x2 mL.  Then 22-gauge 3-1/2 inch  spinal needle was inserted under fluoroscopic guidance targeting the  right S1 SAP transverse process junction, bone contact made, confirmed  with lateral imaging.  Omnipaque 180 under live fluoro demonstrated no  intravascular uptake then 0.5 mL.  Omnipaque 180 demonstrated no  intravascular uptake then 0.5 mL of the solution containing 4 mg/mL  dexamethasone and 2mL of 2% MPF lidocaine was injected.  Then, the right  L5 SAP transverse process junction targeted, bone contact made,  confirmed with lateral imaging.  Omnipaque 180 under live fluoro  demonstrated no intravascular uptake then 0.5 mL of dexamethasone  lidocaine solution injected in the right L4 SAP transverse process  junction targeted, bone contact made, confirmed with lateral imaging.  Omnipaque 180 under live fluoro  demonstrated no intravascular uptake  then 0.5 mL of dexamethasone lidocaine solution was injected.  The  patient tolerated the procedure well.  Pre and post injection vitals  stable.  Post injection instructions given.  I will see her back in  about another month, repeat injections of these are helpful, and the  effect has worn off.  Pre injection pain level 7/10.  Post injection  0/10.      Erick Colace, M.D.  Electronically Signed     AEK/MEDQ  D:  05/12/2008 11:17:36  T:  05/13/2008 01:59:25  Job:  [id number]

## 2010-05-18 NOTE — Assessment & Plan Note (Signed)
[name] returns to clinic today for followup evaluation.  She has  been using slightly increased amounts of hydrocodone and alprazolam  secondary to her aunt's recent death.  She also has been driving her  mother back and forth to the funeral home and the burial this past  weekend and that has caused increased pain.  Her other medicines have  been unchanged.  She does need a refill on her hydrocodone in the office  today.  She continues to stay stable with weight loss and still walks 2  to 3 miles per day on a treadmill.   MEDICATIONS:  1. Levoxyl 137 mcg daily.  2. Centrum Silver daily.  3. Skelaxin 800 mg t.i.d. p.r.n.  4. Alprazolam 0.25 mg t.i.d. p.r.n.  5. Hydrocodone 5/325 one tablet 4 to 5 times per day p.r.n.  6. Metronidazole cream 0.75% p.r.n.   REVIEW OF SYSTEMS:  Noncontributory.   PHYSICAL EXAM:  A well-appearing thin adult female in mild acute  discomfort.  Blood pressure was 129/77 with pulse 79, respiratory rate  18 and O2 saturation 100% on room air.  She has 5-/5 strength  throughout.   IMPRESSION:  Persistent mid/low back pain with mild degenerative disc  disease per MRI study.   In the office today we did refill the patient's hydrocodone, a total of  150 were prescribed today, the 20th of October.  Will plan on seeing the  patient in followup in approximately 4 months' time with refills prior  to that appointment as necessary.  The patient continues to get good  analgesic effect without signs of significant side effects or diversion.           ______________________________  Ellwood Dense, M.D.     DC/MedQ  D:  10/23/2006 09:49:43  T:  10/23/2006 16:40:38  Job #:  [id number]

## 2010-05-18 NOTE — Procedures (Signed)
Crystal Kirby, SANDEFER           ACCOUNT NO.:  [id number]   MEDICAL RECORD NO.:  [id number]           PATIENT TYPE:   LOCATION:                                 FACILITY:   PHYSICIAN:  Erick Colace, M.D.DATE OF BIRTH:  03-05-1955   DATE OF PROCEDURE:  08/25/2008  DATE OF DISCHARGE:                               OPERATIVE REPORT   PROCEDURE:  Bilateral L5 dorsal ramus injection, bilateral L4 medial  branch block, bilateral L3 medial branch block under fluoroscopic  guidance.   INDICATION:  Lumbar facet mediated pain.  She had pain initially more on  the left side.  She underwent a similar procedure in May 2010 at  corresponding levels and had a 7/10 pre injection 0/10 post injection  pain.  She is here for bilateral injections.  Further assess utility of  radiofrequency neurotomy.   Informed consent was obtained after describing risks and benefits of the  procedure with the patient.  These include bleeding, bruising, or  infection.  She elects to proceed and has given written consent.  The  patient placed prone on fluoroscopy table.  Betadine prep, sterile  drape, 25-gauge inch and half needle was used to anesthetize the skin  and subcutaneous tissue 1% lidocaine x1.5 mL at each of 6 sites and a 22-  gauge 3-1/2-inch spinal needle was inserted first starting in the left  S1 SAP sacral ala junction, bone contact made, confirmed with lateral  imaging.  Omnipaque 180 x 0.5 mL demonstrated no intravascular uptake of  0.5 mL solution containing 1 mL 4 mg/mL dexamethasone and 2% MPF  lidocaine were injected.  The left L5 SAP transverse process junction  targeted, bone contact made, confirmed with lateral imaging.  Omnipaque  180 under live fluoro demonstrated no intravascular uptake then 0.5 mL  of the dexamethasone lidocaine solution was injected.  The left L4 SAP  transverse process junction targeted, bone contact made.  Omnipaque 180  x 0.5 mL demonstrated no intravascular  uptake, then 0.5 mL of the  dexamethasone lidocaine solution was injected.  Same procedure was  repeated on the right side using same needle injectate and technique.  The patient tolerated the procedure well.  Pre and post injection vitals  stable.  Post injection instructions given.  Pre injection pain level is  7/10.  Post injection is 0/10.  We will see her back in 6 weeks if only  a short-term relief.  Then, we will go ahead and schedule for  radiofrequency neurotomy.  Her pain is greater than 6/10 and only is  partially responsive to medication management and other conservative  care and does interfere with self-care and mobility.      Erick Colace, M.D.  Electronically Signed     AEK/MEDQ  D:  08/25/2008 11:56:09  T:  08/26/2008 02:36:09  Job:  [id number]

## 2010-05-18 NOTE — Assessment & Plan Note (Signed)
Ms. Crittendon is a 55 year old female followed in this clinic since  September 26, 2005.  She has had pain going back 2006, had some episodes  of back pain with radiation to the right leg and knee area, had  initially some relief with Advil and Vicodin.  She was needing  increasing dosages and therefore was referred to pain management.  Per  her report, she has not had any type of injections for her pain.  She  has had some increase in her dose from 4 tablets per day in 2007 to 6  tablets per day currently.  She has had no signs of aberrant drug  behavior.  I reviewed her chart and I do not see any red flags except on  the last note where it was stated that she had alcohol abuse, attends  [employer], and has had problem with prescription medications and possibly  street drugs.  I asked her about this.  She denies this.  I do not see a  urine drug screen in her chart.  She states she is willing to supply [place]  with one.   Her average pain is 7-8/10, interferes with activity at 7/10 level.  Sleep is fair.  Pain is worse with bending, sitting, improves with rest,  pacing activities, medication relief from meds is good.  She can walk 30-  45 minutes at a time.  She climbs steps.  She drives.  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  Her review of systems is positive for  depression, anxiety.  Negative for suicidal thoughts.  She is having a  workup for a sleep disordered breathing per Dr. Jetty Duhamel next  month.  I went through e-chart and looked at her medical record.  Really, no other hospitalizations that I note.  Her last MRI of the  lumbar spine of 2006, showing some mild to moderate facet joint  degenerative changes at L4-5 and moderate to marked in L5-S1.  Also some  mild changes at L3-4.   There was a small right foraminal disk mass effect on exiting right L2  nerve root.   Blood pressure is 129/76, pulse 90, respirations 18, O2 sat 100% on room  air.  Well-developed, well-nourished  female, in no acute stress.  Orientation x3.  Affect alert.  Gait is normal.  Coordination is normal  in the upper and lower extremities.  She has good neck range of motion.  Her lumbar spine range of motion limited with extension.  Forward  flexion is about 75%, extension is about 25%.  She has good reflex of  bilateral lower extremities.  Good strength.  Negative straight leg  raising.  Good range of motion hips, knees, and ankles.   IMPRESSION:  Back pain with degenerative disk, but with moderate to  marked facet arthropathy even 4 years ago, I would suspect that this has  progressed over time.   RECOMMENDATIONS:  Medial branch blocks to eval for possibility of what  would be a potential benefit of radiofrequency.  We would set her up for  2 sets of blocks.  I described risks and benefits with the patient.  She  would like to do this.  We will schedule this based on availability.   We will go ahead and continue her hydrocodone; however, given that she  is taking 6 tablets a day, we will change to 10/325, #90 per month.  She  can take one half to 1 tablet total not to  exceed 3 tablets a day.  She  is understanding this change.  I also asked her to get an x-ray of her  lumbar spine to look at her facets, we will do 5 views lumbar spine.      Erick Colace, M.D.  Electronically Signed     AEK/MedQ  D:  03/17/2008 10:33:15  T:  03/18/2008 00:42:40  Job #:  [id number]

## 2010-05-18 NOTE — Assessment & Plan Note (Signed)
Crystal Kirby returns to the clinic today for followup evaluation.  She  reports that overall she is doing fairly well.  She did have the fall  back in January when she was trying to fill a bird feeder.  She suffered  no injuries after a fall of approximately 2 to 3 feet.  She  unfortunately had a recent fall off the ladder approximately 8 feet on  May 08, 2007.  Again, she was fortunate not to suffer any fracture or  significant injury.   The patient has been using slightly increased amount of the hydrocodone  and last had a refill on May 23, 2007.  No refill is necessary at this  time.  She does have a form that she would ask [place] to complete to obtain  Skelaxin, and she will be sending that form to [place].   MEDICATIONS:  1. Levoxyl 137 mcg p.o. daily.  2. Centrum Silver daily.  3. Skelaxin 800 mg t.i.d. p.r.n.  4. Alprazolam 0.25 mg t.i.d. p.r.n.  5. Hydrocodone 5/325 one tablet 5 times per day p.r.n.   REVIEW OF SYSTEMS:  Noncontributory.   PHYSICAL EXAMINATION:  A well-appearing fit adult female in mild to no  acute discomfort.  Blood pressure 113/51 with a pulse of 79, respiratory rate 18, and O2  saturation 98% on room air.  She has 5-/5 strength throughout the bilateral upper and lower  extremities.   IMPRESSION:  Persistent low/mid back pain with mild degenerative disk  disease, per MRI study.   In the office today, no refill on medication is necessary.  We will  complete paperwork for her Skelaxin once she sends it to [place].  We will  plan on seeing her in followup in approximately 4 months' time.           ______________________________  Ellwood Dense, M.D.     DC/MedQ  D:  05/31/2007 10:17:51  T:  06/01/2007 00:33:31  Job #:  [id number]

## 2010-05-21 NOTE — Assessment & Plan Note (Signed)
Friday, November 18, 2005   Ms. Wogan returns to clinic today for followup evaluation.  I first and  last saw her in this office September 26, 2005 on referral from Dr.  __________  for evaluation of chronic right lumbar pain.  The patient had an  MRI scan that showed only a small disc protrusion at L1-2 and L2-3 with some  facet degenerative joint disease at L5-S1.  She was judged not to be a  surgical candidate.  She had been getting relief with minimal amounts of  hydrocodone, at that time used anywhere from 0 to 3 tablets per day.  We did  refill the hydrocodone at that time and gave her a prescription for  Skelaxin.   The patient reports that with the change to regular time, she has been  unable to get out to walk as much as she had during daylight savings time.  She did obtain a treadmill over this past weekend and plans on doing that  more on a regular basis.  That seems to help control her pain and decrease  her amount of medication that she needs to take.  She has been using as many  as 5 to 6 tablets of the hydrocodone, where she had been prescribed 4  tablets per day.  She does need a refill in the office today.  She is using  Skelaxin on an as needed basis.   MEDICATIONS:  1. Levoxyl 137 mcg daily.  2. Centrum Silver daily.  3. Skelaxin 800 mg t.i.d. as needed.  4. Alprazolam 0.25 mg t.i.d.  5. Hydrocodone 5/325 one tablet q.i.d. as needed.  6. Metronidazole cream 0.75% daily as needed.   REVIEW OF SYSTEMS:  Non-contributory.   PHYSICAL EXAMINATION:  A well-appearing fit adult female in no acute  discomfort.  Blood pressure 120/70, with a pulse of 70, respiration 16 and O2 saturation  100% on room air.  She has 4+ to 5/5 strength to bilateral upper and lower extremities.  Bulk  tone were normal.  Reflexes were 2+ and symmetrical throughout.  Sensation  was intact to light touch at the bilateral lower extremities.   IMPRESSION:  1. Persistent low back pain with  mild degenerative disc disease per MRI      study.  2. In the office today we did refill the patient's hydrocodone 5/325 a      total of 120 to be used q.i.d. as needed.  I have asked her to not      overuse the medication so that she decreases the occurrence of      tolerance and also did not have as many side effects.  3. We will plan on seeing the patient in followup in this office in      approximately 2 months time with refills prior to that appointment as      necessary.           ______________________________  Ellwood Dense, M.D.     DC/MedQ  D:  11/18/2005 10:15:34  T:  11/18/2005 12:35:38  Job #:  [id number]

## 2010-05-21 NOTE — Assessment & Plan Note (Signed)
Crystal Kirby returns to the clinic today for followup evaluation.  She  reports she is getting good relief from her hydrocodone used 3-4 tablets  per day. She continues to walk 5-7 miles per day, 7 days per week.  When  the weather is not accommodating she uses a treadmill.  She has lost a  total of 77 pounds and plans to lose another 8 pounds and then try to  stop smoking.  She has altered her diet substantially using Slim Fast  and eating probably close to 1000 calories per day.  Her medications  essentially remained unchanged.   MEDICATIONS:  1. Levoxyl 137 mcg  2. Centrum Silver daily  3. Skelaxin 800 mg t.i.d. p.r.n.  4. Alprazolam 0.25 mg t.i.d. p.r.n.  5. Hydrocodone 5/325 one tablet q.i.d. p.r.n.  6. Metronidazole cream 0.75% p.r.n.   REVIEW OF SYSTEMS:  Positive for night sweats.   PHYSICAL EXAMINATION:  GENERAL: Well appearing, fit adult female in mild  to no acute discomfort.  VITALS:  Blood pressure 112/64 with pulse of 78, respirations 16 and 02  saturation 100% of room air.  EXTREMITIES:  She has 4+ to 5/5 strength  in the bilateral upper and lower extremities.  Bulk and tone were  normal. Reflex 2+ and symmetrical.   IMPRESSION:  1. Persistent mild low back pain with mild degenerative disc disease      per MRI study.  In the office today we did refill the patient's hydrocodone 5/325 one  tablet q.i.d. p.r.n. at a total of 120. We will plan on seeing her in  followup in approximately 4 months time as she is getting good analgesic  affect with good activity level and no signs of adverse side effects or  diversion.  We will plan on seeing as noted above.           ______________________________  Ellwood Dense, M.D.     DC/MedQ  D:  01/11/2006 10:00:18  T:  01/11/2006 11:23:27  Job #:  [id number]

## 2010-05-21 NOTE — Group Therapy Note (Signed)
REFERRAL:  Crystal Lias, MD   PURPOSE OF EVALUATION:  Evaluate and treat chronic right lumbar pain.   HISTORY OF PRESENT ILLNESS:  Crystal Kirby is a 55 year old adult female  referred to this office  by Dr. Jeral Fruit for evaluation and treatment of  chronic low back pain.   The patient reports that she initially suffered pain acutely, occurring in  her right lower back, starting approximately 7-8 years ago.  She reports  that she was coming off bleachers at one of her son's sports games and felt  an acute onset of pain in her right buttock region.  She reports that pain  gradually resolved.  She reports that subsequent to that she has had  periodic exacerbations, mostly when she steps wrong or has a near fall.  She  has contacted Dr. Patty Sermons, her primary care physician, and has been  prescribed Vicodin and muscle relaxers in the past with gradual but steady  improvement.   The patient subsequently has been treated with a chiropractor for only one  treatment and had no substantial improvement.   She subsequently has had recurrence of the back pain starting approximately  June 2006.  At that time she decided to undergo an MRI scan of her back  after being seen in the emergency room x2.  An MRI scan was done June 17, 2004, and showed small right foraminal L2-3 disk protrusion causing slight  mass effect on the exiting right L2 nerve root.  There was also a small  broad-based disk bulge/protrusion at L1-2 with mild spinal stenosis.  Additionally, there were facet joint degenerative changes most notable at L5-  S1 level.   The patient was subsequently referred to Dr. Jeral Fruit and started seeing him  July 26, 2004.  At that time he noted the two episodes of low back pain with  radiation to the right leg down to the knee level.  He felt no surgery was  recommended at the time.  He continued prescribing Vicodin and Aleve.  He  also discussed epidural steroid injections and planned  follow-up in 8-10  weeks.   September 23, 2004, the patient saw Dr. Jeral Fruit and reported that she was  getting relief with the Advil and Vicodin.  He again discussed epidural  steroid injections and suggested that they follow up in several months'  time.   From September 2006 through August 2007 the patient reports that she lost  approximately 50-65 pounds overall.  She also started a gradual progressive  walking program and is now walking 1-2 miles per day, 5-6 miles per week.  She subsequently saw Dr. Jeral Fruit in follow-up August 25, 2005, as she was  still having pain despite the weight loss and the gradual exercise program.  She was using approximately 20 Vicodin per month at that time, and Dr.  Jeral Fruit felt that she needed to be referred for pain management.   The patient has also attended 3 sessions of physical therapy and was  instructed in a home exercise program.   Presently the patient reports that she uses anywhere from 0 to 5 Vicodin,  strength 5/325 mg, per day.  She reports that she gets good relief when she  does have to use medicine but fairly frequently goes without any medicine  whatsoever.  She reports that when she does have the pain, it is located in  her right mid-buttock with radiation down the anterior portion of her right  leg to her knee level.  She  reports no problem taking the narcotic medicine  with no grogginess and no constipation.  She reports that she has been using  Skelaxin on a p.r.n. basis.  She would like a refill on that also in the  office today.   PAST MEDICAL HISTORY:  1. Hypothyroidism.  2. Dyslipidemia, off medication at present.  3. Prior tubal ligation.  4. Prior C-section.   FAMILY HISTORY:  Noncontributory with reported colon cancer on her maternal  grandmother's side and diabetes in her mother's side.   ALLERGIES:  PHENERGAN.   MEDICATIONS:  1. Levoxyl 137 mcg daily.  2. Centrum Silver daily.  3. Skelaxin 800 mg t.i.d. p.r.n.   4. Alprazolam 0.25 mg t.i.d.  5. Hydrocodone 5/500 mg one tablet t.i.d. p.r.n. (0-3 per day).  6. Metronidazole cream 0.75% daily p.r.n.  7. Alclometasone 0.05% lotion applied daily to her face.   SOCIAL HISTORY:  The patient is married with 2 grown twin sons.  She works  at a [employer].  She smokes less than one-half pack  of cigarettes per day and reports rare alcohol intake.   REVIEW OF SYSTEMS:  Positive for night sweats.   PHYSICAL EXAMINATION:  GENERAL:  A well-appearing, middle-aged adult female  in mild to no acute discomfort.  VITAL SIGNS:  Blood pressure and vitals were not obtained in the office  today.  MUSCULOSKELETAL/NEUROLOGIC:  The patient is able to ambulate without any  assistive devices.  She is able to toe-walk and heel-walk without  difficulty.  Upper extremity range of motion was full and pain-free.  Upper  extremity exam showed 5/5 strength throughout.  Bulk and tone were normal  and reflexes were 2+ and symmetrical.  Sensation was intact to light touch  throughout the bilateral upper extremities.   Lower extremity exam showed hip flexion, knee extension and ankle  dorsiflexion at 5- to 5/5.  Bulk and tone were normal.  Reflexes were 2+ and  symmetrical.  Sensation was intact to light touch throughout the bilateral  lower extremities.   In supine position, straight leg raise was negative bilaterally and hip  range of motion was normal bilaterally.   In the standing position, lumbar range of motion was only minimally  decreased in extension with good lateral bending, rotation and flexion.   IMPRESSION:  Persistent chronic low back pain with mild degenerative disk  disease per MRI study.   At the present time the patient has had an MRI scan which showed only a  small disk protrusion at L1-2 and L2-3 with some facet degenerative joint disease at L5-S1.  She is not in need of surgical intervention as judged by  Dr. Jeral Fruit.  She does  get good relief with minimal amounts of hydrocodone  used anywhere from 0-3 tablets per day.  We have given her a new  prescription for the hydrocodone but have lowered the Tylenol dose to a  5/325 mg strength.  She will be allowed to [place] it up to 4 times per day on an  as-needed basis.  She seems to be very compliant with medications and I have  warned her against overuse of the medicines to delay developing tolerance.  We have also given her a prescription for Skelaxin 800 mg one tablet t.i.d.  p.r.n. with one refill.  We will plan on seeing the patient in follow-up in  this office in approximately 2 months' time with a refill prior to that  appointment as necessary.  I have asked  her to make sure she gets all pain  medicines only through this office and to bring in bottles of her pain  medicines when we see her in follow-up.  She seems to be very compliant and  is a very responsible person.   We will plan on seeing the patient in follow-up as noted above.           ______________________________  Ellwood Dense, M.D.     DC/MedQ  D:  09/26/2005 12:48:22  T:  09/28/2005 07:09:23  Job #:  [id number]

## 2010-05-21 NOTE — Assessment & Plan Note (Signed)
HISTORY OF PRESENT ILLNESS:  [name] returns to the clinic today  for follow-up evaluation.  She continues to do well with her weight  loss.  She has lost 77 pounds since June 2007.  She reports that she has  another 9 pounds to go.  She continues to walk 3-5 miles every other  day.  She also watches her diet.  Her next goal is to quit tobacco.  She  presently uses one-half pack of cigarettes per day.   In terms of her pain medicine, she gets good relief from the hydrocodone  and uses anywhere from zero to 4 tablets per day.  She does request that  we use a mail-in service for her for a 3 month supply.  They have  apparently contacted her saying that they could fill the hydrocodone  over a 3 month supply.  We will start that process in the office today.  She will need a separate prescription to cover her until that comes in  by mail.   The patient reports that she has had recent right shoulder pain and had  a cortisone injection with an orthopedist.  She reports that has helped  substantially.   MEDICATIONS:  1. Levoxyl 137 mcg daily.  2. Centrum Silver 1 daily.  3. Skelaxin 800 mg t.i.d. p.r.n.  4. Alprazolam 0.25 mg t.i.d. p.r.n.  5. Hydrocodone 5/325 one tablet q.i.d. p.r.n.  6. Metronidazole cream 0.75% p.r.n.   REVIEW OF SYSTEMS:  Noncontributory.   PHYSICAL EXAMINATION:  GENERAL:  Well-appearing fit adult female in mild  to no acute distress.  VITAL SIGNS:  Blood pressure 120/70 with a pulse of 79, respiratory rate  16, and O2 saturation 100% on room air.  NEUROLOGIC:  The patient has 4+ to 5-/5 strength throughout the  bilateral upper and lower extremities.  She ambulates without any  assistive device.   IMPRESSION:  Persistent mild low back pain with mild degenerative disk  disease per MRI study.   PLAN:  At the present time, I have refilled the patient's hydrocodone as  of today, a total of 84 tablets to cover her until her mail-in  prescription comes in.   The mail-in prescription is written for 360  tablets which is a 55-month supply, i.e., 4 tablets per day p.r.n.  Will  plan on seeing the patient in follow-up in this office in approximately  3 month's time with refills of Skelaxin as necessary before next  appointment.           ______________________________  Ellwood Dense, M.D.     DC/MedQ  D:  05/08/2006 09:22:10  T:  05/08/2006 09:57:20  Job #:  [id number]

## 2010-06-07 ENCOUNTER — Telehealth: Payer: Self-pay | Admitting: *Deleted

## 2010-06-07 MED ORDER — AMPHETAMINE-DEXTROAMPHET ER 30 MG PO CP24: ORAL_CAPSULE | ORAL | Status: DC

## 2010-06-07 MED ORDER — AMPHETAMINE-DEXTROAMPHETAMINE 10 MG PO TABS: ORAL_TABLET | ORAL | Status: DC

## 2010-06-07 NOTE — Telephone Encounter (Signed)
Called, spoke with pt.  She is requesting we mail 90 day rx for adderal 10mg  and adderal xr 30mg  to [facility].    Last OV 09/15/09 Pending OV 09/14/10  Rxs both given on 03/10/10 for 90 days  Rxs printed and placed on CDY's cart.

## 2010-06-09 NOTE — Telephone Encounter (Signed)
Rxs need to be mailed.  [employer], spoke with Mardelle Matte -- mailing address for these rxs will be [facility] [id number] [place] [address].  Rxs placed in mail -- [id number] to inform pt of this.

## 2010-06-09 NOTE — Telephone Encounter (Signed)
Both Rx's have been faxed as requested.

## 2010-06-09 NOTE — Telephone Encounter (Signed)
Patient phoned stated she was returning a call to triage she can be reached at [phone].Crystal Kirby

## 2010-06-09 NOTE — Telephone Encounter (Signed)
Called, spoke with pt.  She is aware rxs placed in mail to [facility].  She verbalized understanding of this and voiced no further questions at this time.

## 2010-07-14 ENCOUNTER — Other Ambulatory Visit: Payer: Self-pay | Admitting: Cardiology

## 2010-07-16 ENCOUNTER — Telehealth: Payer: Self-pay | Admitting: Internal Medicine

## 2010-07-16 NOTE — Telephone Encounter (Signed)
Dr. Patty Sermons was the original prescriber for this medication but they told [facility] that the pt needs to get from pcp or dr. Maple Hudson.  I called the pt to see if she had a PCP and she states she has been going to Dr. Patty Sermons since she was 57. She states she will call them and go from there. [name], CMA

## 2010-07-19 ENCOUNTER — Telehealth: Payer: Self-pay | Admitting: Internal Medicine

## 2010-07-19 NOTE — Telephone Encounter (Signed)
I advised we spoke to the pt on Friday and she was going to get her PCP to refill the medication. [name], CMA

## 2010-09-02 ENCOUNTER — Telehealth: Payer: Self-pay | Admitting: Internal Medicine

## 2010-09-02 MED ORDER — AMPHETAMINE-DEXTROAMPHETAMINE 10 MG PO TABS: ORAL_TABLET | ORAL | Status: DC

## 2010-09-02 MED ORDER — AMPHETAMINE-DEXTROAMPHET ER 30 MG PO CP24: ORAL_CAPSULE | ORAL | Status: DC

## 2010-09-02 NOTE — Telephone Encounter (Signed)
Mailed as requested.

## 2010-09-02 NOTE — Telephone Encounter (Signed)
Rx's has been printed and awaiting CDY signature. Please advise Dr. Maple Hudson. Thanks  Carver Fila, CMA

## 2010-09-14 ENCOUNTER — Ambulatory Visit (INDEPENDENT_AMBULATORY_CARE_PROVIDER_SITE_OTHER): Payer: 59 | Admitting: Internal Medicine

## 2010-09-14 ENCOUNTER — Encounter: Payer: Self-pay | Admitting: Internal Medicine

## 2010-09-14 VITALS — BP 114/66 | HR 74 | Ht 64.0 in | Wt 196.4 lb

## 2010-09-14 DIAGNOSIS — G4710 Hypersomnia, unspecified: Secondary | ICD-10-CM

## 2010-09-14 DIAGNOSIS — Z23 Encounter for immunization: Secondary | ICD-10-CM

## 2010-09-14 NOTE — Patient Instructions (Signed)
Please call as needed  Flu vax 

## 2010-09-14 NOTE — Progress Notes (Signed)
  Subjective:    Patient ID: Crystal Kirby, female    DOB: April 08, 1955, 55 y.o.   MRN: [id number]  HPI 09/14/10  55 yoF Smoker followed for idiopathic hypersomnia.  Last here September 15, 2009 Getting ready to move into new home [place] same land. Remains out of work since last year. Has been moody- to see her Gyn about hormone levels. Adderall does help her. Always a night owl and slow morning starter. Takes Nucynta opioid for back pain. Uses alprazolam most days - as needed. Occasional nap does help.   Review of Systems Constitutional:   No-   weight loss, night sweats, fevers, chills, fatigue, lassitude. HEENT:   No-  headaches, difficulty swallowing, tooth/dental problems, sore throat,       No-  sneezing, itching, ear ache, nasal congestion, post nasal drip,  CV:  No-   chest pain, orthopnea, PND, swelling in lower extremities, anasarca, dizziness, palpitations Resp: No-   shortness of breath with exertion or at rest.              No-   productive cough,  No non-productive cough,  No-  coughing up of blood.              No-   change in color of mucus.  No- wheezing.   Skin: No-   rash or lesions. GI:  No-   heartburn, indigestion, abdominal pain, nausea, vomiting, diarrhea,                 change in bowel habits, loss of appetite GU: No-   dysuria, change in color of urine, no urgency or frequency.  No- flank pain. MS:  No-   joint pain or swelling.  No- decreased range of motion.  +back pain. Neuro- grossly normal to observation, Or:  Psych:  No- change in mood or affect. No depression or anxiety.  No memory loss.      Objective:   Physical Exam General- Alert, Oriented, Affect-appropriate, Distress- none acute, calm here Skin- rash-none, lesions- none, excoriation- none Lymphadenopathy- none Head- atraumatic            Eyes- Gross vision intact, PERRLA, conjunctivae clear secretions            Ears- Hearing, canals normal- minor wax             Nose- Clear, no-Septal dev,  mucus, polyps, erosion, perforation             Throat- Mallampati II , mucosa clear , drainage- none, tonsils- atrophic Neck- flexible , trachea midline, no stridor , thyroid nl, carotid no bruit Chest - symmetrical excursion , unlabored           Heart/CV- RRR , no murmur , no gallop  , no rub, nl s1 s2                           - JVD- none , edema- none, stasis changes- none, varices- none           Lung- clear to P&A, wheeze- none, cough- none , dullness-none, rub- none           Chest wall-  Abd- tender-no, distended-no, bowel sounds-present, HSM- no Br/ Gen/ Rectal- Not done, not indicated Extrem- cyanosis- none, clubbing, none, atrophy- none, strength- nl Neuro- grossly intact to observation         Assessment & Plan:

## 2010-09-14 NOTE — Assessment & Plan Note (Signed)
We agreed her med management is probably as good as we can get it for now- she doesn't want to go up or down. We discussed influence on her mood and agreed with plan for her to get checked by her Gyn.

## 2010-12-02 ENCOUNTER — Telehealth: Payer: Self-pay | Admitting: Internal Medicine

## 2010-12-02 MED ORDER — AMPHETAMINE-DEXTROAMPHETAMINE 10 MG PO TABS: ORAL_TABLET | ORAL | Status: DC

## 2010-12-02 MED ORDER — AMPHETAMINE-DEXTROAMPHET ER 30 MG PO CP24: ORAL_CAPSULE | ORAL | Status: DC

## 2010-12-02 NOTE — Telephone Encounter (Signed)
Rx's signed and faxed to [facility] as requested.

## 2010-12-02 NOTE — Telephone Encounter (Signed)
Pt requesting refills of her adderall.  These have been printed and placed on CY cart and pt requesting that these be sent to [facility].

## 2010-12-06 ENCOUNTER — Telehealth: Payer: Self-pay | Admitting: Internal Medicine

## 2010-12-06 MED ORDER — AMPHETAMINE-DEXTROAMPHET ER 30 MG PO CP24: ORAL_CAPSULE | ORAL | Status: DC

## 2010-12-06 MED ORDER — AMPHETAMINE-DEXTROAMPHETAMINE 10 MG PO TABS: ORAL_TABLET | ORAL | Status: DC

## 2010-12-06 NOTE — Telephone Encounter (Signed)
Called and spoke with pt.  Pt states [facility] will not accept the faxed prescriptions we sent for her 2 scripts of Adderall.  Will need to reprint these rxs and have CY sign them and then mail to [facility].

## 2010-12-06 NOTE — Telephone Encounter (Signed)
Called and spoke with [facility] address of [facility] [address] [place], [place] [address]; Pt is aware that RX's have been placed in mail.

## 2010-12-06 NOTE — Telephone Encounter (Signed)
LMOM for pt TCB 

## 2010-12-07 ENCOUNTER — Telehealth: Payer: Self-pay | Admitting: Cardiology

## 2010-12-07 DIAGNOSIS — F329 Major depressive disorder, single episode, unspecified: Secondary | ICD-10-CM

## 2010-12-07 NOTE — Telephone Encounter (Signed)
New message: pt called and wanted to know when she had lab the last time.  Please call and advise.  She also wants to ask about a prescription.

## 2010-12-07 NOTE — Telephone Encounter (Signed)
Left message

## 2010-12-09 MED ORDER — PAROXETINE HCL 20 MG PO TABS: 20.0000 mg | ORAL_TABLET | Freq: Two times a day (BID) | ORAL | Status: DC

## 2010-12-09 NOTE — Telephone Encounter (Signed)
Patient has no job or insurance currently and has been off her Paxil.  Wants to go back on it and would like for you to fill. Last labs 12/2009 No appointment scheduled and has not been seen recently.  Does see Dr Melba Coon and Dr Evlyn Kanner (for thyroid).  Please advise

## 2010-12-09 NOTE — Telephone Encounter (Signed)
Advised patient

## 2010-12-09 NOTE — Telephone Encounter (Signed)
Fu call °Pt returning your call  °

## 2010-12-09 NOTE — Telephone Encounter (Signed)
Okay to refill Paxil 10 mg one twice a day #60 refill x5

## 2010-12-09 NOTE — Telephone Encounter (Signed)
Left message

## 2010-12-18 ENCOUNTER — Other Ambulatory Visit: Payer: Self-pay | Admitting: Cardiology

## 2011-03-02 ENCOUNTER — Telehealth: Payer: Self-pay | Admitting: Internal Medicine

## 2011-03-03 MED ORDER — AMPHETAMINE-DEXTROAMPHETAMINE 10 MG PO TABS: ORAL_TABLET | ORAL | Status: DC

## 2011-03-03 MED ORDER — AMPHETAMINE-DEXTROAMPHET ER 30 MG PO CP24: ORAL_CAPSULE | ORAL | Status: DC

## 2011-03-03 NOTE — Telephone Encounter (Signed)
I have printed RX's,singed by CY, and mailed to patient. I left message on patients phone that we have taken care of this and if any questions or concerns please call our office.

## 2011-03-21 ENCOUNTER — Other Ambulatory Visit: Payer: Self-pay | Admitting: Cardiology

## 2011-03-21 DIAGNOSIS — F419 Anxiety disorder, unspecified: Secondary | ICD-10-CM

## 2011-03-21 NOTE — Telephone Encounter (Signed)
Fu call Pt made an appt for [phone].  Please refill alprazolam-[facility]. Candice had called earlier and said she needed to make an appt before refill

## 2011-03-21 NOTE — Telephone Encounter (Signed)
New problem:  Patient returning call back to [employer].

## 2011-03-22 MED ORDER — ALPRAZOLAM 0.5 MG PO TABS: 0.5000 mg | ORAL_TABLET | Freq: Four times a day (QID) | ORAL | Status: DC | PRN

## 2011-04-21 ENCOUNTER — Telehealth: Payer: Self-pay | Admitting: Internal Medicine

## 2011-04-21 NOTE — Telephone Encounter (Signed)
Pt is requesting a new RX for both doses of her Adderall. Last RX written for these medicatons on 03/03/11. Pt says we can mail these to her home address, which was verified. Pls advise if okay for refills.

## 2011-04-21 NOTE — Telephone Encounter (Signed)
LMTCB

## 2011-04-25 MED ORDER — AMPHETAMINE-DEXTROAMPHET ER 30 MG PO CP24: ORAL_CAPSULE | ORAL | Status: DC

## 2011-04-25 MED ORDER — AMPHETAMINE-DEXTROAMPHETAMINE 10 MG PO TABS: ORAL_TABLET | ORAL | Status: DC

## 2011-04-25 NOTE — Telephone Encounter (Signed)
rx signed and mailed ' Pt aware

## 2011-04-25 NOTE — Telephone Encounter (Signed)
Pt last seen by CDY 9.11.12, follow up in 1 year > 9.11.13  Adderall 10mg  4 daily prn #120 and adderrall 30mg  tid prn #90 were printed on 2.28.13.  Scripts printed for CDY to sign with addressed envelope and placed on CDY's cart.

## 2011-04-28 ENCOUNTER — Ambulatory Visit: Payer: 59 | Admitting: Cardiology

## 2011-05-14 ENCOUNTER — Emergency Department (HOSPITAL_COMMUNITY)
Admission: EM | Admit: 2011-05-14 | Discharge: 2011-05-14 | Disposition: A | Discharge status: Home / Self Care | Payer: Medicare HMO | Attending: Emergency Medicine | Admitting: Emergency Medicine

## 2011-05-14 ENCOUNTER — Emergency Department (HOSPITAL_COMMUNITY): Payer: Medicare HMO

## 2011-05-14 ENCOUNTER — Encounter (HOSPITAL_COMMUNITY): Payer: Self-pay | Admitting: Emergency Medicine

## 2011-05-14 DIAGNOSIS — Z79899 Other long term (current) drug therapy: Secondary | ICD-10-CM | POA: Insufficient documentation

## 2011-05-14 DIAGNOSIS — K429 Umbilical hernia without obstruction or gangrene: Secondary | ICD-10-CM

## 2011-05-14 DIAGNOSIS — K7689 Other specified diseases of liver: Secondary | ICD-10-CM | POA: Insufficient documentation

## 2011-05-14 DIAGNOSIS — J984 Other disorders of lung: Secondary | ICD-10-CM | POA: Insufficient documentation

## 2011-05-14 DIAGNOSIS — E039 Hypothyroidism, unspecified: Secondary | ICD-10-CM | POA: Insufficient documentation

## 2011-05-14 DIAGNOSIS — R911 Solitary pulmonary nodule: Secondary | ICD-10-CM

## 2011-05-14 DIAGNOSIS — N83202 Unspecified ovarian cyst, left side: Secondary | ICD-10-CM

## 2011-05-14 DIAGNOSIS — N83209 Unspecified ovarian cyst, unspecified side: Secondary | ICD-10-CM | POA: Insufficient documentation

## 2011-05-14 DIAGNOSIS — R1011 Right upper quadrant pain: Secondary | ICD-10-CM | POA: Insufficient documentation

## 2011-05-14 LAB — DIFFERENTIAL
Basophils Absolute: 0 10*3/uL (ref 0.0–0.1)
Basophils Relative: 0 % (ref 0–1)
Eosinophils Absolute: 0.2 10*3/uL (ref 0.0–0.7)
Lymphs Abs: 2.3 10*3/uL (ref 0.7–4.0)
Monocytes Absolute: 0.5 10*3/uL (ref 0.1–1.0)
Neutro Abs: 9 10*3/uL — ABNORMAL HIGH (ref 1.7–7.7)

## 2011-05-14 LAB — COMPREHENSIVE METABOLIC PANEL
AST: 24 U/L (ref 0–37)
Alkaline Phosphatase: 121 U/L — ABNORMAL HIGH (ref 39–117)
BUN: 14 mg/dL (ref 6–23)
CO2: 22 mEq/L (ref 19–32)
Calcium: 10.3 mg/dL (ref 8.4–10.5)
Creatinine, Ser: 0.63 mg/dL (ref 0.50–1.10)
GFR calc Af Amer: >90 mL/min (ref >90)
GFR calc non Af Amer: >90 mL/min (ref >90)
Potassium: 4 mEq/L (ref 3.5–5.1)
Sodium: 136 mEq/L (ref 135–145)
Total Bilirubin: 0.5 mg/dL (ref 0.3–1.2)

## 2011-05-14 LAB — CBC
HCT: 43.6 % (ref 36.0–46.0)
Hemoglobin: 15.5 g/dL — ABNORMAL HIGH (ref 12.0–15.0)
MCH: 31.4 pg (ref 26.0–34.0)
RBC: 4.93 MIL/uL (ref 3.87–5.11)
RDW: 13.6 % (ref 11.5–15.5)
WBC: 12 10*3/uL — ABNORMAL HIGH (ref 4.0–10.5)

## 2011-05-14 LAB — PREGNANCY, URINE: Preg Test, Ur: NEGATIVE

## 2011-05-14 LAB — URINALYSIS, ROUTINE W REFLEX MICROSCOPIC
Bilirubin Urine: NEGATIVE
Glucose, UA: NEGATIVE mg/dL
Leukocytes, UA: NEGATIVE
Protein, ur: NEGATIVE mg/dL
pH: 8 (ref 5.0–8.0)

## 2011-05-14 MED ORDER — HYDROMORPHONE HCL PF 1 MG/ML IJ SOLN
1.0000 mg | Freq: Once | INTRAMUSCULAR | Status: AC
  Administered 2011-05-14: 1 mg via INTRAVENOUS
  Filled 2011-05-14: qty 1

## 2011-05-14 MED ORDER — IOHEXOL 300 MG/ML  SOLN
100.0000 mL | Freq: Once | INTRAMUSCULAR | Status: AC | PRN
  Administered 2011-05-14: 100 mL via INTRAVENOUS

## 2011-05-14 MED ORDER — SODIUM CHLORIDE 0.9 % IV BOLUS (SEPSIS)
1000.0000 mL | Freq: Once | INTRAVENOUS | Status: AC
  Administered 2011-05-14: 1000 mL via INTRAVENOUS

## 2011-05-14 MED ORDER — HYDROCODONE-ACETAMINOPHEN 5-325 MG PO TABS: 1.0000 | ORAL_TABLET | ORAL | Status: AC | PRN

## 2011-05-14 MED ORDER — ONDANSETRON HCL 4 MG/2ML IJ SOLN
4.0000 mg | Freq: Once | INTRAMUSCULAR | Status: AC
  Administered 2011-05-14: 4 mg via INTRAVENOUS
  Filled 2011-05-14: qty 2

## 2011-05-14 MED ORDER — ONDANSETRON HCL 4 MG PO TABS: 4.0000 mg | ORAL_TABLET | Freq: Three times a day (TID) | ORAL | Status: AC | PRN

## 2011-05-14 NOTE — ED Notes (Signed)
[id number]<QI> Expected date:05/14/11<BR> Expected time: 6:44 AM<BR> Means of arrival:<BR> Comments:<BR> Abd pain

## 2011-05-14 NOTE — ED Provider Notes (Signed)
History     CSN: [id number]  Arrival date & time 05/14/11  7829   First MD Initiated Contact with Patient 05/14/11 [phone]      Chief Complaint  Patient presents with  . Abdominal Pain    (Consider location/radiation/quality/duration/timing/severity/associated sxs/prior treatment) HPI Comments: Patient reports acute onset of RUQ pain that began last night around 9pm.  The pain is sharp and achy, constant, no radiation, with associated nausea, dry heaving, chills, and body aches.  Denies change in bowel habits, last BM was yesterday morning and normal - no hematochezia, no diarrhea.  Denies urinary symptoms, CP, SOB, cough.  Pt has hx abdominal surgeries: tubal ligation, c-section.    Patient is a 56 y.o. female presenting with abdominal pain. The history is provided by the patient.  Abdominal Pain The primary symptoms of the illness include abdominal pain, nausea and vomiting. The primary symptoms of the illness do not include fever, shortness of breath, diarrhea, hematemesis or dysuria. The current episode started 13 to 24 hours ago. The onset of the illness was sudden. The problem has not changed since onset. The abdominal pain began 13 to24 hours ago. The pain came on suddenly. The abdominal pain is located in the RUQ. The abdominal pain does not radiate. The abdominal pain is relieved by nothing.  The patient states that she believes she is currently not pregnant. The patient has not had a change in bowel habit. Risk factors for an acute abdominal problem include a history of abdominal surgery. Additional symptoms associated with the illness include chills. Symptoms associated with the illness do not include urgency or frequency.    Past Medical History  Diagnosis Date  . Hypothyroid   . Hypersomnia     Past Surgical History  Procedure Date  . Nasal septoplasty w/ turbinoplasty   . Nasal sinus surgery   . Tubal ligation     Family History  Problem Relation Age of Onset  .  Emphysema Father   . Colon cancer      multiple maternal aunts/uncles    History  Substance Use Topics  . Smoking status: Current Everyday Smoker -- 0.5 packs/day for 25 years  . Smokeless tobacco: Not on file  . Alcohol Use: Yes    OB History    Grav Para Term Preterm Abortions TAB SAB Ect Mult Living                  Review of Systems  Constitutional: Positive for chills. Negative for fever.  Respiratory: Negative for cough and shortness of breath.   Cardiovascular: Negative for chest pain.  Gastrointestinal: Positive for nausea, vomiting and abdominal pain. Negative for diarrhea and hematemesis.  Genitourinary: Negative for dysuria, urgency and frequency.  All other systems reviewed and are negative.    Allergies  Promethazine hcl  Home Medications   Current Outpatient Rx  Name Route Sig Dispense Refill  . ALPRAZOLAM 0.5 MG PO TABS Oral Take 1 tablet (0.5 mg total) by mouth 4 (four) times daily as needed. 120 tablet 1  . AMPHETAMINE-DEXTROAMPHET ER 30 MG PO CP24  1 three times a day as needed 90 capsule 0  . AMPHETAMINE-DEXTROAMPHETAMINE 10 MG PO TABS  4 daily as needed 120 tablet 0  . LEVOXYL 175 MCG PO TABS Oral Take 1 tablet by mouth daily.    Marland Kitchen METHOCARBAMOL 750 MG PO TABS Oral Take 750 mg by mouth 3 (three) times daily.      Marland Kitchen ONE-DAILY MULTI VITAMINS PO TABS  Oral Take 1 tablet by mouth daily.      . NUCYNTA 50 MG PO TABS  3 daily prn    . PAROXETINE HCL 20 MG PO TABS Oral Take 1 tablet (20 mg total) by mouth 2 (two) times daily. 60 tablet 5  . PRAVASTATIN SODIUM 40 MG PO TABS  TAKE 1 TABLET DAILY 15 tablet 0    NEEDS TO SCHEDULE AN OFFICE VISIT WITH DR.BRACKBIL ...  . VITAMIN D (ERGOCALCIFEROL) 50000 UNITS PO CAPS Oral Take 50,000 Units by mouth every 7 (seven) days.        BP 167/82  Pulse 80  Temp(Src) 97.5 F (36.4 C) (Oral)  Resp 18  Ht 5\' 4"  (1.626 m)  Wt 200 lb (90.719 kg)  BMI 34.33 kg/m2  SpO2 100%  Physical Exam  Nursing note and vitals  reviewed. Constitutional: She is oriented to person, place, and time. She appears well-developed and well-nourished. She appears distressed.  HENT:  Head: Normocephalic and atraumatic.  Neck: Neck supple.  Cardiovascular: Normal rate, regular rhythm and normal heart sounds.   Pulmonary/Chest: Breath sounds normal. No respiratory distress. She has no wheezes. She has no rales. She exhibits no tenderness.  Abdominal: Soft. Bowel sounds are normal. She exhibits no distension. There is tenderness in the right upper quadrant and right lower quadrant. There is no rigidity, no rebound and no CVA tenderness.  Neurological: She is alert and oriented to person, place, and time.  Skin: She is not diaphoretic.    ED Course  Procedures (including critical care time)  Labs Reviewed  CBC - Abnormal; Notable for the following:    WBC 12.0 (*)    Hemoglobin 15.5 (*)    All other components within normal limits  DIFFERENTIAL - Abnormal; Notable for the following:    Neutro Abs 9.0 (*)    All other components within normal limits  COMPREHENSIVE METABOLIC PANEL - Abnormal; Notable for the following:    Glucose, Bld 116 (*)    Alkaline Phosphatase 121 (*)    All other components within normal limits  URINALYSIS, ROUTINE W REFLEX MICROSCOPIC - Abnormal; Notable for the following:    Ketones, ur TRACE (*)    All other components within normal limits  LIPASE, BLOOD  PREGNANCY, URINE   US Abdomen Complete  05/14/2011  *RADIOLOGY REPORT*  Clinical Data:  Right upper quadrant pain, nausea and vomiting  COMPLETE ABDOMINAL ULTRASOUND  Comparison:  None.  Findings:  Gallbladder:  No gallbladder wall thickening or pericholecystic fluid.  No gallstones are present.  Negative sonographic Murphy's sign.  Common bile duct:  Normal at 4 mm  Liver:  Liver is coarsened in echotexture.  No ductal dilatation. There are several regions of hypoattenuation suggesting fatty sparing.  IVC:  Appears normal.  Pancreas:  No focal  abnormality seen.  Spleen:  Normal and size and echogenicity.  Right Kidney:  11.6cm in length.  No hydronephrosis  Left Kidney:  11.8cm in length.  No hydronephrosis.  Abdominal aorta:  No aneurysm identified.  IMPRESSION:  1.  Echogenic liver commonly represents hepatic steatosis. 2.  Normal gallbladder.  Original Report Authenticated By: Genevive Bi, M.D.   Ct Abdomen Pelvis W Contrast  05/14/2011  *RADIOLOGY REPORT*  Clinical Data: Abdominal pain, nausea  CT ABDOMEN AND PELVIS WITH CONTRAST  Technique:  Multidetector CT imaging of the abdomen and pelvis was performed following the standard protocol during bolus administration of intravenous contrast.  Contrast: OMNIPAQUE IOHEXOL 300 MG/ML  SOLN  Comparison: None.  Findings:  Sagittal images of the spine shows mild degenerative changes lower thoracic and upper lumbar spine.  Lung bases shows a 7.5 mm noncalcified nodule right lower lobe posteriorly.  Follow-up CT scan of the chest in 3 months is recommended to assure stability.  There is mild hepatic fatty infiltration.  No focal hepatic mass.  No intrahepatic biliary ductal dilatation.  No calcified gallstones are noted within gallbladder.  The pancreas, spleen and adrenal glands are unremarkable.  Kidneys are symmetrical in size and enhancement.  Delayed renal images shows bilateral renal symmetrical excretion.  Bilateral visualized proximal ureter is unremarkable.  No hydronephrosis or hydroureter.  Mild atherosclerotic calcifications of the abdominal aorta and the iliac arteries.  No aortic aneurysm.  There is stool in the cecum.  No pericecal inflammation.  Normal appendix is clearly visualized in axial image 60.  Tiny umbilical hernia containing fat without evidence of acute complication. There is a probable small partially calcified lymph node mid anterior mesentery umbilical region measures 8.8 mm.  No small bowel obstruction.  No ascites or free air.  There is minimal levoscoliosis of the  lower lumbar spine.  The uterus is unremarkable.  There is a cyst / follicle within left ovary measures 1.5 cm.  No pelvic ascites or adenopathy.  The urinary bladder is unremarkable.  IMPRESSION:  1.  Mild hepatic fatty infiltration. 2.  No small bowel or colonic obstruction. 3.  Normal appendix is clearly visualized.  No pericecal inflammation.  4.  No hydronephrosis or hydroureter. 5.  Mild degenerative changes thoracolumbar spine. Minimal lower lumbar levoscoliosis. 6.  There is a 7.5 mm noncalcified nodule in the right lung lower lobe posteriorly.  Follow-up CT scan of the chest in 3 months is recommended to assure stability.  Original Report Authenticated By: Natasha Mead, M.D.   Dg Abd Acute W/chest  05/14/2011  *RADIOLOGY REPORT*  Clinical Data: Right upper quadrant abdominal pain, nausea and vomiting.  ACUTE ABDOMEN SERIES (ABDOMEN 2 VIEW & CHEST 1 VIEW)  Comparison: Prior lumbar spine x-rays.  Findings: Lungs are clear bilaterally.  No infiltrate or edema. Heart size is normal.  Abdominal films show a normal bowel gas pattern without evidence of obstruction or ileus.  No free air identified.  No abnormal calcifications.  Stable degenerative changes of the lumbar spine.  IMPRESSION: No acute findings.  Original Report Authenticated By: Reola Calkins, M.D.    8:28 AM Patient reports pain is improving.  On reexamination of abdomen, patient continues to be tender both in RLQ and RUQ.  No guarding, no rebound.  Imaging pending.   12:27 PM Discussed all results with patient.  Pt aware of pulmonary nodule and need for 3 month follow up for reimaging.  Return precautions discussed.    1. Abdominal pain   2. Lung nodule seen on imaging study   3. Ovarian cyst, left   4. Umbilical hernia       MDM  Afebrile patient with acute onset RUQ pain with nausea beginning last night, pain and nausea relieved in ED.  CT scan, [place], xray, labs do not reveal cause of patient's symptoms.  Discussed all results  with patient.  While there is no obvious acute process, I have advised patient she may be early in a process - also patient notes she has changed her diet recently and has had increased gas and bloating.  Care instructions, d/c home with pain and nausea medication, PCP follow up, return precautions discussed.  Patient verbalizes understanding and agrees with plan.          Dillard Cannon [place], [place] 05/14/11 1541

## 2011-05-14 NOTE — ED Notes (Signed)
MD at bedside. 

## 2011-05-14 NOTE — Discharge Instructions (Signed)
Read the information below.  Do not use additional tylenol while taking the prescribed pain medication.  If you continue to have pain, please follow up with Dr Patty Sermons.  If you develop high fevers, uncontrolled pain or vomiting, or are unable to tolerate fluids by mouth, return to the ER for a recheck.  You may return to the ER at any time for worsening condition or any new symptoms that concern you.   Abdominal Pain Abdominal pain can be caused by many things. Your caregiver decides the seriousness of your pain by an examination and possibly blood tests and X-rays. Many cases can be observed and treated at home. Most abdominal pain is not caused by a disease and will probably improve without treatment. However, in many cases, more time must pass before a clear cause of the pain can be found. Before that point, it may not be known if you need more testing, or if hospitalization or surgery is needed. HOME CARE INSTRUCTIONS   Do not take laxatives unless directed by your caregiver.   Take pain medicine only as directed by your caregiver.   Only take over-the-counter or prescription medicines for pain, discomfort, or fever as directed by your caregiver.   Try a clear liquid diet (broth, tea, or water) for as long as directed by your caregiver. Slowly move to a bland diet as tolerated.  SEEK IMMEDIATE MEDICAL CARE IF:   The pain does not go away.   You have a fever.   You keep throwing up (vomiting).   The pain is felt only in portions of the abdomen. Pain in the right side could possibly be appendicitis. In an adult, pain in the left lower portion of the abdomen could be colitis or diverticulitis.   You pass bloody or black tarry stools.  MAKE SURE YOU:   Understand these instructions.   Will watch your condition.   Will get help right away if you are not doing well or get worse.  Document Released: 09/29/2004 Document Revised: 12/09/2010 Document Reviewed: 08/08/2007 [facility]  Patient Information 2012 [place], [place]  .Hernia A hernia occurs when an internal organ pushes out through a weak spot in the abdominal wall. Hernias most commonly occur in the groin and around the navel. Hernias often can be pushed back into place (reduced). Most hernias tend to get worse over time. Some abdominal hernias can get stuck in the opening (irreducible or incarcerated hernia) and cannot be reduced. An irreducible abdominal hernia which is tightly squeezed into the opening is at risk for impaired blood supply (strangulated hernia). A strangulated hernia is a medical emergency. Because of the risk for an irreducible or strangulated hernia, surgery may be recommended to repair a hernia. CAUSES   Heavy lifting.   Prolonged coughing.   Straining to have a bowel movement.   A cut (incision) made during an abdominal surgery.  HOME CARE INSTRUCTIONS   Bed rest is not required. You may continue your normal activities.   Avoid lifting more than 10 pounds (4.5 kg) or straining.   Cough gently. If you are a smoker it is best to stop. Even the best hernia repair can break down with the continual strain of coughing. Even if you do not have your hernia repaired, a cough will continue to aggravate the problem.   Do not wear anything tight over your hernia. Do not try to keep it in with an outside bandage or truss. These can damage abdominal contents if they are trapped within  the hernia sac.   Eat a normal diet.   Avoid constipation. Straining over long periods of time will increase hernia size and encourage breakdown of repairs. If you cannot do this with diet alone, stool softeners may be used.  SEEK IMMEDIATE MEDICAL CARE IF:   You have a fever.   You develop increasing abdominal pain.   You feel nauseous or vomit.   Your hernia is stuck outside the abdomen, looks discolored, feels hard, or is tender.   You have any changes in your bowel habits or in the hernia that are unusual for  you.   You have increased pain or swelling around the hernia.   You cannot push the hernia back in place by applying gentle pressure while lying down.  MAKE SURE YOU:   Understand these instructions.   Will watch your condition.   Will get help right away if you are not doing well or get worse.  Document Released: 12/20/2004 Document Revised: 12/09/2010 Document Reviewed: 08/09/2007 [facility] Patient Information 2012 [place], [place].  Ovarian Cyst The ovaries are small organs that are on each side of the uterus. The ovaries are the organs that produce the female hormones, estrogen and progesterone. An ovarian cyst is a sac filled with fluid that can vary in its size. It is normal for a small cyst to form in women who are in the childbearing age and who have menstrual periods. This type of cyst is called a follicle cyst that becomes an ovulation cyst (corpus luteum cyst) after it produces the women's egg. It later goes away on its own if the woman does not become pregnant. There are other kinds of ovarian cysts that may cause problems and may need to be treated. The most serious problem is a cyst with cancer. It should be noted that menopausal women who have an ovarian cyst are at a higher risk of it being a cancer cyst. They should be evaluated very quickly, thoroughly and followed closely. This is especially true in menopausal women because of the high rate of ovarian cancer in women in menopause. CAUSES AND TYPES OF OVARIAN CYSTS:  FUNCTIONAL CYST: The follicle/corpus luteum cyst is a functional cyst that occurs every month during ovulation with the menstrual cycle. They go away with the next menstrual cycle if the woman does not get pregnant. Usually, there are no symptoms with a functional cyst.   ENDOMETRIOMA CYST: This cyst develops from the lining of the uterus tissue. This cyst gets in or on the ovary. It grows every month from the bleeding during the menstrual period. It is also called a  "chocolate cyst" because it becomes filled with blood that turns brown. This cyst can cause pain in the lower abdomen during intercourse and with your menstrual period.   CYSTADENOMA CYST: This cyst develops from the cells on the outside of the ovary. They usually are not cancerous. They can get very big and cause lower abdomen pain and pain with intercourse. This type of cyst can twist on itself, cut off its blood supply and cause severe pain. It also can easily rupture and cause a lot of pain.   DERMOID CYST: This type of cyst is sometimes found in both ovaries. They are found to have different kinds of body tissue in the cyst. The tissue includes skin, teeth, hair, and/or cartilage. They usually do not have symptoms unless they get very big. Dermoid cysts are rarely cancerous.   POLYCYSTIC OVARY: This is a rare condition with  hormone problems that produces many small cysts on both ovaries. The cysts are follicle-like cysts that never produce an egg and become a corpus luteum. It can cause an increase in body weight, infertility, acne, increase in body and facial hair and lack of menstrual periods or rare menstrual periods. Many women with this problem develop type 2 diabetes. The exact cause of this problem is unknown. A polycystic ovary is rarely cancerous.   THECA LUTEIN CYST: Occurs when too much hormone (human chorionic gonadotropin) is produced and over-stimulates the ovaries to produce an egg. They are frequently seen when doctors stimulate the ovaries for invitro-fertilization (test tube babies).   LUTEOMA CYST: This cyst is seen during pregnancy. Rarely it can cause an obstruction to the birth canal during labor and delivery. They usually go away after delivery.  SYMPTOMS   Pelvic pain or pressure.   Pain during sexual intercourse.   Increasing girth (swelling) of the abdomen.   Abnormal menstrual periods.   Increasing pain with menstrual periods.   You stop having menstrual periods  and you are not pregnant.  DIAGNOSIS  The diagnosis can be made during:  Routine or annual pelvic examination (common).   Ultrasound.   X-ray of the pelvis.   CT Scan.   MRI.   Blood tests.  TREATMENT   Treatment may only be to follow the cyst monthly for 2 to 3 months with your caregiver. Many go away on their own, especially functional cysts.   May be aspirated (drained) with a long needle with ultrasound, or by laparoscopy (inserting a tube into the pelvis through a small incision).   The whole cyst can be removed by laparoscopy.   Sometimes the cyst may need to be removed through an incision in the lower abdomen.   Hormone treatment is sometimes used to help dissolve certain cysts.   Birth control pills are sometimes used to help dissolve certain cysts.  HOME CARE INSTRUCTIONS  Follow your caregiver's advice regarding:  Medicine.   Follow up visits to evaluate and treat the cyst.   You may need to come back or make an appointment with another caregiver, to find the exact cause of your cyst, if your caregiver is not a gynecologist.   Get your yearly and recommended pelvic examinations and Pap tests.   Let your caregiver know if you have had an ovarian cyst in the past.  SEEK MEDICAL CARE IF:   Your periods are late, irregular, they stop, or are painful.   Your stomach (abdomen) or pelvic pain does not go away.   Your stomach becomes larger or swollen.   You have pressure on your bladder or trouble emptying your bladder completely.   You have painful sexual intercourse.   You have feelings of fullness, pressure, or discomfort in your stomach.   You lose weight for no apparent reason.   You feel generally ill.   You become constipated.   You lose your appetite.   You develop acne.   You have an increase in body and facial hair.   You are gaining weight, without changing your exercise and eating habits.   You think you are pregnant.  SEEK IMMEDIATE  MEDICAL CARE IF:   You have increasing abdominal pain.   You feel sick to your stomach (nausea) and/or vomit.   You develop a fever that comes on suddenly.   You develop abdominal pain during a bowel movement.   Your menstrual periods become heavier than usual.  Document  Released: 12/20/2004 Document Revised: 12/09/2010 Document Reviewed: 10/23/2008 [facility] Patient Information 2012 [place], [place].

## 2011-05-14 NOTE — ED Notes (Signed)
As per EMS, c/o abdominal pain started yesterday evening. Nausea for brief periods last night.

## 2011-05-18 NOTE — ED Provider Notes (Signed)
Medical screening examination/treatment/procedure(s) were performed by non-physician practitioner and as supervising physician I was immediately available for consultation/collaboration.  Raeford Razor, MD 05/18/11 1719

## 2011-05-23 ENCOUNTER — Telehealth: Payer: Self-pay | Admitting: Internal Medicine

## 2011-05-23 MED ORDER — AMPHETAMINE-DEXTROAMPHET ER 30 MG PO CP24: 30.0000 mg | ORAL_CAPSULE | Freq: Three times a day (TID) | ORAL | Status: DC | PRN

## 2011-05-23 MED ORDER — AMPHETAMINE-DEXTROAMPHETAMINE 10 MG PO TABS: 10.0000 mg | ORAL_TABLET | Freq: Four times a day (QID) | ORAL | Status: DC | PRN

## 2011-05-23 NOTE — Telephone Encounter (Signed)
Last OV with Dr. Maple Hudson - 09/14/10, was asked to f/u in 1 yr. Pending OV with Dr. Maple Hudson 09/16/11 Both, the adderall 10 mg and adderall xr 30 mg rxs were last given on 04/25/11.    Rxs printed for a 30 day supply and placed on Dr. Roxy Cedar desk for signature.

## 2011-05-23 NOTE — Telephone Encounter (Signed)
Done

## 2011-05-23 NOTE — Telephone Encounter (Signed)
rxs placed in mail to pt's home address - left msg on pt's vm stating the rxs she has requested have been placed in mail to her home address.  Asked to pls call back if anything further is needed.

## 2011-05-26 ENCOUNTER — Encounter: Payer: Self-pay | Admitting: *Deleted

## 2011-06-03 ENCOUNTER — Ambulatory Visit: Payer: 59 | Admitting: Cardiology

## 2011-06-13 ENCOUNTER — Ambulatory Visit: Payer: 59 | Admitting: Cardiology

## 2011-07-01 ENCOUNTER — Ambulatory Visit: Payer: 59 | Admitting: Cardiology

## 2011-07-06 ENCOUNTER — Other Ambulatory Visit: Payer: Self-pay | Admitting: *Deleted

## 2011-07-06 DIAGNOSIS — F419 Anxiety disorder, unspecified: Secondary | ICD-10-CM

## 2011-07-06 MED ORDER — ALPRAZOLAM 0.5 MG PO TABS: 0.5000 mg | ORAL_TABLET | Freq: Four times a day (QID) | ORAL | Status: DC | PRN

## 2011-07-06 NOTE — Telephone Encounter (Signed)
Refilled alprazolam one time

## 2011-07-12 ENCOUNTER — Telehealth: Payer: Self-pay | Admitting: Internal Medicine

## 2011-07-12 NOTE — Telephone Encounter (Signed)
I spoke with pt and she states her generic adderall is too expensive. Pt states she she has different insurance now and she has to pay for it out of pocket and then mail the receipt to her insurance and then she only gets 70% back. Pt is wanting to if something is cheaper she can take or does she need to wait until she comes in for her OV 09/19/11. Please advise Dr. Maple Hudson thanks  Allergies  Allergen Reactions  . Promethazine Hcl     REACTION: rash

## 2011-07-12 NOTE — Telephone Encounter (Signed)
Pt aware. Crystal Kirby, CMA  

## 2011-07-12 NOTE — Telephone Encounter (Signed)
Per CY-have patient try occasion NoDoz tablet OTC.

## 2011-07-14 ENCOUNTER — Ambulatory Visit: Payer: 59 | Admitting: Cardiology

## 2011-08-08 ENCOUNTER — Other Ambulatory Visit: Payer: Self-pay | Admitting: Cardiology

## 2011-08-10 ENCOUNTER — Telehealth: Payer: Self-pay | Admitting: Internal Medicine

## 2011-08-10 ENCOUNTER — Other Ambulatory Visit: Payer: Self-pay | Admitting: *Deleted

## 2011-08-10 DIAGNOSIS — F419 Anxiety disorder, unspecified: Secondary | ICD-10-CM

## 2011-08-10 MED ORDER — ALPRAZOLAM 0.5 MG PO TABS: 0.5000 mg | ORAL_TABLET | Freq: Four times a day (QID) | ORAL | Status: DC | PRN

## 2011-08-10 MED ORDER — AMPHETAMINE-DEXTROAMPHET ER 30 MG PO CP24: 30.0000 mg | ORAL_CAPSULE | Freq: Three times a day (TID) | ORAL | Status: DC | PRN

## 2011-08-10 NOTE — Telephone Encounter (Signed)
Mailed to patient as requested.

## 2011-08-10 NOTE — Telephone Encounter (Signed)
Refilled alprazolam one time,has upcoming appointment

## 2011-08-10 NOTE — Telephone Encounter (Signed)
I spoke with pt and she stated she only needed the adderall 20 mg mailed to her. I confirmed mailing address and advised her once this is sign will place in the mail for her. She voiced her understanding and needed nothing further

## 2011-08-23 ENCOUNTER — Ambulatory Visit: Payer: 59 | Admitting: Cardiology

## 2011-09-14 ENCOUNTER — Ambulatory Visit: Payer: 59 | Admitting: Internal Medicine

## 2011-09-19 ENCOUNTER — Ambulatory Visit: Payer: 59 | Admitting: Internal Medicine

## 2011-09-21 ENCOUNTER — Telehealth: Payer: Self-pay | Admitting: Internal Medicine

## 2011-09-21 MED ORDER — AMPHETAMINE-DEXTROAMPHET ER 30 MG PO CP24: 30.0000 mg | ORAL_CAPSULE | Freq: Three times a day (TID) | ORAL | Status: DC | PRN

## 2011-09-21 MED ORDER — AMPHETAMINE-DEXTROAMPHETAMINE 10 MG PO TABS: 10.0000 mg | ORAL_TABLET | Freq: Four times a day (QID) | ORAL | Status: DC | PRN

## 2011-09-21 NOTE — Telephone Encounter (Signed)
Rx printed and placed on cart to be signed Last ov 09-14-10 Next ov 10-27-11 Thanks!

## 2011-09-22 NOTE — Telephone Encounter (Signed)
Mailed to patient

## 2011-09-23 ENCOUNTER — Telehealth: Payer: Self-pay | Admitting: Cardiology

## 2011-09-23 NOTE — Telephone Encounter (Signed)
Pt needs refill alprazolam 0.5 mg pt out

## 2011-09-26 ENCOUNTER — Telehealth: Payer: Self-pay | Admitting: Cardiology

## 2011-09-26 NOTE — Telephone Encounter (Signed)
[facility], told her per melinda message it will not be refilled at this time per dr Patty Sermons, gave message to pharmacy who will let pt know/mt

## 2011-09-26 NOTE — Telephone Encounter (Signed)
[facility] and patients machine earlier today

## 2011-09-26 NOTE — Telephone Encounter (Signed)
Left message unable to fill at this time per  Dr. Patty Sermons and to discuss at [facility] 10/04/11, [facility]

## 2011-10-04 ENCOUNTER — Telehealth: Payer: Self-pay | Admitting: Cardiology

## 2011-10-04 ENCOUNTER — Encounter: Payer: Self-pay | Admitting: Cardiology

## 2011-10-04 ENCOUNTER — Ambulatory Visit (INDEPENDENT_AMBULATORY_CARE_PROVIDER_SITE_OTHER): Payer: Medicare HMO | Admitting: Cardiology

## 2011-10-04 VITALS — BP 124/83 | HR 81 | Ht 64.0 in | Wt 197.8 lb

## 2011-10-04 DIAGNOSIS — F411 Generalized anxiety disorder: Secondary | ICD-10-CM

## 2011-10-04 DIAGNOSIS — E7800 Pure hypercholesterolemia, unspecified: Secondary | ICD-10-CM

## 2011-10-04 DIAGNOSIS — F419 Anxiety disorder, unspecified: Secondary | ICD-10-CM

## 2011-10-04 DIAGNOSIS — G4710 Hypersomnia, unspecified: Secondary | ICD-10-CM

## 2011-10-04 DIAGNOSIS — E039 Hypothyroidism, unspecified: Secondary | ICD-10-CM

## 2011-10-04 MED ORDER — ALPRAZOLAM 0.5 MG PO TABS: 0.5000 mg | ORAL_TABLET | Freq: Four times a day (QID) | ORAL | Status: DC | PRN

## 2011-10-04 NOTE — Progress Notes (Signed)
Crystal Kirby Date of Birth:  January 27, 1955 [facility] [address] [address] [place], [place]  [address] [phone]  Fax   [phone]  HPI: This pleasant 56 year old woman is seen for a followup office visit.  She was last seen in March 2011 .he has a history of hypercholesterolemia.  She has risk factors for vascular disease.  She smokes one half pack cigarettes a day.  Her mother recently had a stroke and is now in a nursing home.  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 ALPRAZolam (XANAX) 0.5 MG tablet Take 1 tablet (0.5 mg total) by mouth 4 (four) times daily as needed. For anxiety  30 tablet  0  . amphetamine-dextroamphetamine (ADDERALL XR) 30 MG 24 hr capsule Take 1 capsule (30 mg total) by mouth 3 (three) times daily as needed.  90 capsule  0  . LEVOXYL 175 MCG tablet Take 1 tablet by mouth daily.      . Multiple Vitamin (MULTIVITAMIN) tablet Take 1 tablet by mouth daily.        Marland Kitchen PARoxetine (PAXIL) 20 MG tablet TAKE 1 TABLET BY MOUTH 2 TIMES DAILY.  60 tablet  4  . traMADol (ULTRAM) 50 MG tablet Take 50 mg by mouth every 6 (six) hours as needed. For back pain      . DISCONTD: amphetamine-dextroamphetamine (ADDERALL) 10 MG tablet Take 1 tablet (10 mg total) by mouth 4 (four) times daily as needed.  120 tablet  0    Allergies  Allergen Reactions  . Promethazine Hcl     REACTION: rash    Patient Active Problem List  Diagnosis  . HYPERSOMNIA, IDIOPATHIC  . Hypothyroid    History  Smoking status  . Current Every Day Smoker -- 0.5 packs/day for 25 years  Smokeless tobacco  . Not on file    History  Alcohol Use  . Yes    Family History  Problem Relation Age of Onset  . Emphysema Father     . Colon cancer      multiple maternal aunts/uncles    Review of Systems: The patient denies any heat or cold intolerance.  No weight gain or weight loss.  The patient denies headaches or blurry vision.  There is no cough or sputum production.  The patient denies dizziness.  There is no hematuria or hematochezia.  The patient denies any muscle aches or arthritis.  The patient denies any rash.  The patient denies frequent falling or instability.  There is no history of depression or anxiety.  All other systems were reviewed and are negative.   Physical Exam: Filed Vitals:   10/04/11 0854  BP: 124/83  Pulse: 81   the general appearance reveals a well-developed well-nourished woman in no distress.The head and neck exam reveals pupils equal and reactive.  Extraocular movements are full.  There is no scleral icterus.  The mouth and pharynx are normal.  The neck is supple.  The carotids reveal no bruits.  The jugular venous pressure is normal.  The  thyroid is not enlarged.  There is no lymphadenopathy.  The chest is clear to percussion and auscultation.  There are no rales or rhonchi.  Expansion of the chest is symmetrical.  The precordium is quiet.  The first heart sound is normal.  The second heart sound is physiologically split.  There is no murmur gallop rub or click.  There is no abnormal lift or heave.  The abdomen is soft and nontender.  The bowel sounds are normal.  The liver and spleen are not enlarged.  There are no abdominal masses.  There are no abdominal bruits.  Extremities reveal good pedal pulses.  There is no phlebitis or edema.  There is no cyanosis or clubbing.  Strength is normal and symmetrical in all extremities.  There is no lateralizing weakness.  There are no sensory deficits.  The skin is warm and dry.  There is no rash.  EKG shows normal sinus rhythm and is within normal limits   Assessment / Plan: The patient is not currently having any cardiac symptoms.  She does have  ongoing risk factors of hypercholesterolemia, and exogenous obesity, ongoing smoking, and sedentary lifestyle.  We talked about the changes that she should make.  He will continue same medication and she will get a full lab work at her endocrinologist office next week.  We will plan to see her back in one year for followup office visit.

## 2011-10-04 NOTE — Assessment & Plan Note (Signed)
Patient has a history of hypothyroidism.  She is on Synthroid.  She will be getting complete lab work at Dr. Rinaldo Cloud office next week.

## 2011-10-04 NOTE — Assessment & Plan Note (Signed)
The patient has a history of narcolepsy.  She takes Adderall but only on a when necessary basis

## 2011-10-04 NOTE — Patient Instructions (Signed)
Work harder on [employer] loss  Your physician recommends that you continue on your current medications as directed. Please refer to the Current Medication list given to you today.  Your physician wants you to follow-up in: 1 year You will receive a reminder letter in the mail two months in advance. If you don't receive a letter, please call our office to schedule the follow-up appointment.

## 2011-10-27 ENCOUNTER — Ambulatory Visit (INDEPENDENT_AMBULATORY_CARE_PROVIDER_SITE_OTHER): Payer: Medicare HMO | Admitting: Internal Medicine

## 2011-10-27 ENCOUNTER — Encounter: Payer: Self-pay | Admitting: Internal Medicine

## 2011-10-27 VITALS — BP 130/76 | HR 86 | Ht 64.0 in | Wt 199.8 lb

## 2011-10-27 DIAGNOSIS — Z23 Encounter for immunization: Secondary | ICD-10-CM

## 2011-10-27 DIAGNOSIS — G4710 Hypersomnia, unspecified: Secondary | ICD-10-CM

## 2011-10-27 DIAGNOSIS — E039 Hypothyroidism, unspecified: Secondary | ICD-10-CM

## 2011-10-27 MED ORDER — AMPHETAMINE-DEXTROAMPHET ER 30 MG PO CP24: 30.0000 mg | ORAL_CAPSULE | Freq: Three times a day (TID) | ORAL | Status: DC | PRN

## 2011-10-27 NOTE — Patient Instructions (Addendum)
Refill script for Adderall 30 mg  Watch for the effect of sedating meds, your thyroid status, and be sure you get adequate sleep. If you are snoring a lot, your husband can notice if you are stopping breathing/ sleep apnea, which is something we can treat to help your sleepiness.

## 2011-10-27 NOTE — Progress Notes (Signed)
Subjective:    Patient ID: Crystal Kirby, female    DOB: 11-25-1955, 56 y.o.   MRN: [id number]  HPI 09/14/10  55 yoF Smoker followed for idiopathic hypersomnia.  Last here September 15, 2009 Getting ready to move into new home on same land. Remains out of work since last year. Has been moody- to see her Gyn about hormone levels. Adderall does help her. Always a night owl and slow morning starter. Takes Nucynta opioid for back pain. Uses alprazolam most days - as needed. Occasional nap does help.   10/27/11- 56 yoF Smoker followed for idiopathic hypersomnia.  Temporary job with for insurance and medication access and sometimes difficult. She uses Adderall, either 30 mg ER (1-3/ day) or regular 10 mg. Sleeps well. We discussed the effect of her sedating medications but she claims they do not cause drowsiness. She was off of alprazolam for 3 months says there are times when she needs it. We discussed medications and her mail order coverage.  Review of Systems- see HPI Constitutional:   No-   weight loss, night sweats, fevers, chills, fatigue, lassitude. HEENT:   No-  headaches, difficulty swallowing, tooth/dental problems, sore throat,       No-  sneezing, itching, ear ache, nasal congestion, post nasal drip,  CV:  No-   chest pain, orthopnea, PND, swelling in lower extremities, anasarca, dizziness, palpitations Resp: No-   shortness of breath with exertion or at rest.              No-   productive cough,  No non-productive cough,  No-  coughing up of blood.              No-   change in color of mucus.  No- wheezing.   Skin: No-   rash or lesions. GI:  No-   heartburn, indigestion, abdominal pain, nausea, vomiting,  GU:  MS:  No-   joint pain or swelling.  . Neuro- nothing unusual Psych:  No- change in mood or affect. Some depression or anxiety.  No memory loss.  Objective:   Physical Exam General- Alert, Oriented, Affect-appropriate, Distress- none acute, calm here Skin- rash-none,  lesions- none, excoriation- none Lymphadenopathy- none Head- atraumatic            Eyes- Gross vision intact, PERRLA, conjunctivae clear secretions            Ears- Hearing, canals normal- minor wax             Nose- Clear, no-Septal dev, mucus, polyps, erosion, perforation             Throat- Mallampati II , mucosa clear , drainage- none, tonsils- atrophic Neck- flexible , trachea midline, no stridor , thyroid nl, carotid no bruit Chest - symmetrical excursion , unlabored           Heart/CV- RRR , no murmur , no gallop  , no rub, nl s1 s2                           - JVD- none , edema- none, stasis changes- none, varices- none           Lung- clear to P&A, wheeze- none, cough- none , dullness-none, rub- none           Chest wall-  Abd-  Br/ Gen/ Rectal- Not done, not indicated Extrem- cyanosis- none, clubbing, none, atrophy- none, strength- nl Neuro- grossly intact to observation. Personality  seems appropriate.  Assessment & Plan:

## 2011-10-28 DIAGNOSIS — Z23 Encounter for immunization: Secondary | ICD-10-CM

## 2011-11-05 NOTE — Assessment & Plan Note (Signed)
There is interaction among medications and she may get some daytime somnolence meds. She does not have specific features such as cataplexy to suggest narcolepsy. We discussed measurement of organic sleepiness with MSLT if necessary.

## 2011-11-05 NOTE — Assessment & Plan Note (Signed)
She says blood levels have been okay

## 2011-11-23 ENCOUNTER — Telehealth: Payer: Self-pay | Admitting: Internal Medicine

## 2011-11-23 MED ORDER — AMPHETAMINE-DEXTROAMPHET ER 30 MG PO CP24: 30.0000 mg | ORAL_CAPSULE | Freq: Three times a day (TID) | ORAL | Status: DC | PRN

## 2011-11-23 NOTE — Telephone Encounter (Signed)
Pt aware to come pick up Rx tomorrow around 5pm.

## 2011-12-16 ENCOUNTER — Telehealth: Payer: Self-pay | Admitting: Internal Medicine

## 2011-12-16 MED ORDER — AMPHETAMINE-DEXTROAMPHET ER 30 MG PO CP24: 30.0000 mg | ORAL_CAPSULE | Freq: Three times a day (TID) | ORAL | Status: DC | PRN

## 2011-12-16 NOTE — Telephone Encounter (Signed)
Called pt and let her know that we will get CY to sign the rx on Monday and mail it out then.

## 2012-02-27 ENCOUNTER — Telehealth: Payer: Self-pay | Admitting: Internal Medicine

## 2012-02-27 NOTE — Telephone Encounter (Signed)
Ok to refill Aderall

## 2012-02-27 NOTE — Telephone Encounter (Signed)
Last seen 10/27/11 Last fill 12/16/11 #90  Please advise if this is okay to refill. Thanks.

## 2012-02-28 MED ORDER — AMPHETAMINE-DEXTROAMPHET ER 30 MG PO CP24: 30.0000 mg | ORAL_CAPSULE | Freq: Three times a day (TID) | ORAL | Status: DC | PRN

## 2012-02-28 NOTE — Telephone Encounter (Signed)
RX signed by Zack Seal and mailed to patient as requested. Pt aware.

## 2012-02-28 NOTE — Telephone Encounter (Signed)
Rx printed and placed on CDY's cart for signature.

## 2012-03-13 ENCOUNTER — Other Ambulatory Visit: Payer: Self-pay | Admitting: *Deleted

## 2012-03-13 DIAGNOSIS — F419 Anxiety disorder, unspecified: Secondary | ICD-10-CM

## 2012-03-14 MED ORDER — ALPRAZOLAM 0.5 MG PO TABS: 0.5000 mg | ORAL_TABLET | Freq: Four times a day (QID) | ORAL | Status: DC | PRN

## 2012-05-31 ENCOUNTER — Telehealth: Payer: Self-pay | Admitting: Internal Medicine

## 2012-05-31 MED ORDER — AMPHETAMINE-DEXTROAMPHET ER 30 MG PO CP24: 30.0000 mg | ORAL_CAPSULE | Freq: Three times a day (TID) | ORAL | Status: DC | PRN

## 2012-05-31 NOTE — Telephone Encounter (Signed)
Pt aware will mail out to her once RX is signed. Mailing address confirmed. Nothing further was needed

## 2012-05-31 NOTE — Telephone Encounter (Signed)
Rx signed by CY and placed to go in the mail Pt is aware Will sign off

## 2012-09-24 ENCOUNTER — Telehealth: Payer: Self-pay | Admitting: Internal Medicine

## 2012-09-24 MED ORDER — AMPHETAMINE-DEXTROAMPHET ER 30 MG PO CP24: 30.0000 mg | ORAL_CAPSULE | Freq: Three times a day (TID) | ORAL | Status: DC | PRN

## 2012-09-24 NOTE — Telephone Encounter (Signed)
Last OV 10/27/11 Pending OV 10/26/12 Last fill 05/31/12 #90  Allergies  Allergen Reactions  . Promethazine Hcl     REACTION: rash    CY - please advise on refill. Thanks.

## 2012-09-24 NOTE — Telephone Encounter (Signed)
Per CY-okay to refill rx. Rx printed,singed, and mailed to patient.

## 2012-09-27 ENCOUNTER — Other Ambulatory Visit: Payer: Self-pay | Admitting: *Deleted

## 2012-09-27 DIAGNOSIS — F419 Anxiety disorder, unspecified: Secondary | ICD-10-CM

## 2012-09-27 MED ORDER — ALPRAZOLAM 0.5 MG PO TABS: 0.5000 mg | ORAL_TABLET | Freq: Four times a day (QID) | ORAL | Status: DC | PRN

## 2012-09-27 NOTE — Telephone Encounter (Signed)
Alprazolam 0.5 mg tablet script called to [facility]

## 2012-09-28 ENCOUNTER — Other Ambulatory Visit: Payer: Self-pay | Admitting: *Deleted

## 2012-10-26 ENCOUNTER — Ambulatory Visit: Payer: Medicare HMO | Admitting: Internal Medicine

## 2012-11-08 ENCOUNTER — Other Ambulatory Visit: Payer: Self-pay

## 2012-11-15 ENCOUNTER — Telehealth: Payer: Self-pay | Admitting: Internal Medicine

## 2012-11-15 MED ORDER — AMPHETAMINE-DEXTROAMPHET ER 30 MG PO CP24: 30.0000 mg | ORAL_CAPSULE | Freq: Three times a day (TID) | ORAL | Status: DC | PRN

## 2012-11-15 NOTE — Telephone Encounter (Signed)
Pt requesting refill of Adderall 30mg  ER Last filled 09/24/12 #90 x 0 refills  Rx to be printed and given to CY to sign--pt to come pick up today.

## 2012-11-15 NOTE — Telephone Encounter (Signed)
LMOM x 1 

## 2012-11-15 NOTE — Telephone Encounter (Signed)
Rx signed and at front for pick up today.

## 2012-11-30 ENCOUNTER — Ambulatory Visit: Payer: Medicare HMO | Admitting: Internal Medicine

## 2013-01-04 ENCOUNTER — Telehealth: Payer: Self-pay | Admitting: Internal Medicine

## 2013-01-04 NOTE — Telephone Encounter (Signed)
lmomtcb for pt Last OV with CDY: 10/27/11 Pending OV with CDY: 01/29/13 Adderall 30mg  tid prn rx given 11/15/12 #[id number]

## 2013-01-07 ENCOUNTER — Ambulatory Visit: Payer: Medicare HMO | Admitting: Internal Medicine

## 2013-01-07 MED ORDER — AMPHETAMINE-DEXTROAMPHET ER 30 MG PO CP24: 30.0000 mg | ORAL_CAPSULE | Freq: Three times a day (TID) | ORAL | Status: DC | PRN

## 2013-01-07 NOTE — Telephone Encounter (Signed)
rx printed and placed on CY cart to sign.Carron CurieJennifer Abeer Deskins, CMA

## 2013-01-07 NOTE — Telephone Encounter (Signed)
Rx signed by CY and placed in mail to patient. I called and left message for patient regarding this; if any questions or concerns please call the office.

## 2013-01-29 ENCOUNTER — Ambulatory Visit: Payer: Medicare HMO | Admitting: Internal Medicine

## 2013-02-22 ENCOUNTER — Ambulatory Visit: Payer: Medicare HMO | Admitting: Internal Medicine

## 2013-03-21 ENCOUNTER — Ambulatory Visit: Payer: Medicare HMO | Admitting: Internal Medicine

## 2013-04-23 ENCOUNTER — Ambulatory Visit: Payer: Medicare HMO | Admitting: Internal Medicine

## 2018-05-14 DIAGNOSIS — F329 Major depressive disorder, single episode, unspecified: Secondary | ICD-10-CM | POA: Diagnosis not present

## 2018-05-14 DIAGNOSIS — E559 Vitamin D deficiency, unspecified: Secondary | ICD-10-CM | POA: Diagnosis not present

## 2018-05-14 DIAGNOSIS — H0520 Unspecified exophthalmos: Secondary | ICD-10-CM | POA: Diagnosis not present

## 2018-05-14 DIAGNOSIS — E669 Obesity, unspecified: Secondary | ICD-10-CM | POA: Diagnosis not present

## 2018-05-14 DIAGNOSIS — E039 Hypothyroidism, unspecified: Secondary | ICD-10-CM | POA: Diagnosis not present

## 2018-05-14 DIAGNOSIS — K760 Fatty (change of) liver, not elsewhere classified: Secondary | ICD-10-CM | POA: Diagnosis not present

## 2018-05-14 DIAGNOSIS — E785 Hyperlipidemia, unspecified: Secondary | ICD-10-CM | POA: Diagnosis not present

## 2018-05-14 DIAGNOSIS — R7309 Other abnormal glucose: Secondary | ICD-10-CM | POA: Diagnosis not present

## 2018-05-23 DIAGNOSIS — M545 Low back pain: Secondary | ICD-10-CM | POA: Diagnosis not present

## 2018-05-23 DIAGNOSIS — M5136 Other intervertebral disc degeneration, lumbar region: Secondary | ICD-10-CM | POA: Diagnosis not present

## 2018-07-09 DIAGNOSIS — K760 Fatty (change of) liver, not elsewhere classified: Secondary | ICD-10-CM | POA: Diagnosis not present

## 2018-07-09 DIAGNOSIS — E7849 Other hyperlipidemia: Secondary | ICD-10-CM | POA: Diagnosis not present

## 2018-07-09 DIAGNOSIS — R7309 Other abnormal glucose: Secondary | ICD-10-CM | POA: Diagnosis not present

## 2018-07-09 DIAGNOSIS — E038 Other specified hypothyroidism: Secondary | ICD-10-CM | POA: Diagnosis not present

## 2018-08-22 DIAGNOSIS — M545 Low back pain: Secondary | ICD-10-CM | POA: Diagnosis not present

## 2018-08-22 DIAGNOSIS — M5136 Other intervertebral disc degeneration, lumbar region: Secondary | ICD-10-CM | POA: Diagnosis not present

## 2018-09-21 DIAGNOSIS — E669 Obesity, unspecified: Secondary | ICD-10-CM | POA: Diagnosis not present

## 2018-09-21 DIAGNOSIS — E039 Hypothyroidism, unspecified: Secondary | ICD-10-CM | POA: Diagnosis not present

## 2018-09-21 DIAGNOSIS — K760 Fatty (change of) liver, not elsewhere classified: Secondary | ICD-10-CM | POA: Diagnosis not present

## 2018-09-21 DIAGNOSIS — G894 Chronic pain syndrome: Secondary | ICD-10-CM | POA: Diagnosis not present

## 2018-09-21 DIAGNOSIS — F418 Other specified anxiety disorders: Secondary | ICD-10-CM | POA: Diagnosis not present

## 2018-09-21 DIAGNOSIS — I700 Atherosclerosis of aorta: Secondary | ICD-10-CM | POA: Diagnosis not present

## 2018-09-21 DIAGNOSIS — E785 Hyperlipidemia, unspecified: Secondary | ICD-10-CM | POA: Diagnosis not present

## 2018-09-21 DIAGNOSIS — E559 Vitamin D deficiency, unspecified: Secondary | ICD-10-CM | POA: Diagnosis not present

## 2018-09-21 DIAGNOSIS — H0520 Unspecified exophthalmos: Secondary | ICD-10-CM | POA: Diagnosis not present

## 2018-11-22 DIAGNOSIS — M545 Low back pain: Secondary | ICD-10-CM | POA: Diagnosis not present

## 2019-02-19 DIAGNOSIS — M545 Low back pain: Secondary | ICD-10-CM | POA: Diagnosis not present

## 2019-02-19 DIAGNOSIS — R030 Elevated blood-pressure reading, without diagnosis of hypertension: Secondary | ICD-10-CM | POA: Diagnosis not present

## 2019-02-19 DIAGNOSIS — M5136 Other intervertebral disc degeneration, lumbar region: Secondary | ICD-10-CM | POA: Diagnosis not present

## 2019-02-19 DIAGNOSIS — Z6835 Body mass index (BMI) 35.0-35.9, adult: Secondary | ICD-10-CM | POA: Diagnosis not present

## 2019-02-23 ENCOUNTER — Ambulatory Visit: Payer: Medicare HMO | Attending: Internal Medicine

## 2019-02-23 DIAGNOSIS — Z23 Encounter for immunization: Secondary | ICD-10-CM | POA: Insufficient documentation

## 2019-02-23 NOTE — Progress Notes (Signed)
   Covid-19 Vaccination Clinic  Name:  Crystal Kirby    MRN: [id number] DOB: 21-Oct-1955  02/23/2019  Crystal Kirby was observed post Covid-19 immunization for 15 minutes without incidence. She was provided with Vaccine Information Sheet and instruction to access the V-Safe system.   Crystal Kirby was instructed to call 911 with any severe reactions post vaccine: Marland Kitchen Difficulty breathing  . Swelling of your face and throat  . A fast heartbeat  . A bad rash all over your body  . Dizziness and weakness    Immunizations Administered    Name Date Dose VIS Date Route   Pfizer COVID-19 Vaccine 02/23/2019  1:54 PM 0.3 mL 12/14/2018 Intramuscular   Manufacturer: [employer], [employer]   Lot: [id number]   NDC: [address]-2

## 2019-03-15 DIAGNOSIS — F17210 Nicotine dependence, cigarettes, uncomplicated: Secondary | ICD-10-CM | POA: Diagnosis not present

## 2019-03-15 DIAGNOSIS — I700 Atherosclerosis of aorta: Secondary | ICD-10-CM | POA: Diagnosis not present

## 2019-03-15 DIAGNOSIS — E559 Vitamin D deficiency, unspecified: Secondary | ICD-10-CM | POA: Diagnosis not present

## 2019-03-15 DIAGNOSIS — H0520 Unspecified exophthalmos: Secondary | ICD-10-CM | POA: Diagnosis not present

## 2019-03-15 DIAGNOSIS — E785 Hyperlipidemia, unspecified: Secondary | ICD-10-CM | POA: Diagnosis not present

## 2019-03-15 DIAGNOSIS — E039 Hypothyroidism, unspecified: Secondary | ICD-10-CM | POA: Diagnosis not present

## 2019-03-15 DIAGNOSIS — K760 Fatty (change of) liver, not elsewhere classified: Secondary | ICD-10-CM | POA: Diagnosis not present

## 2019-03-15 DIAGNOSIS — F418 Other specified anxiety disorders: Secondary | ICD-10-CM | POA: Diagnosis not present

## 2019-03-15 DIAGNOSIS — G894 Chronic pain syndrome: Secondary | ICD-10-CM | POA: Diagnosis not present

## 2019-03-19 ENCOUNTER — Ambulatory Visit: Payer: Medicare HMO | Attending: Internal Medicine

## 2019-03-19 DIAGNOSIS — Z23 Encounter for immunization: Secondary | ICD-10-CM

## 2019-03-19 NOTE — Progress Notes (Signed)
   Covid-19 Vaccination Clinic  Name:  Crystal Kirby    MRN: [id number] DOB: 09-Mar-1955  03/19/2019  Crystal Kirby was observed post Covid-19 immunization for 15 minutes without incident. She was provided with Vaccine Information Sheet and instruction to access the V-Safe system.   Crystal Kirby was instructed to call 911 with any severe reactions post vaccine: Marland Kitchen Difficulty breathing  . Swelling of face and throat  . A fast heartbeat  . A bad rash all over body  . Dizziness and weakness   Immunizations Administered    Name Date Dose VIS Date Route   Pfizer COVID-19 Vaccine 03/19/2019  1:30 PM 0.3 mL 12/14/2018 Intramuscular   Manufacturer: [employer], [employer]   Lot: [id number]   NDC: [address]-9

## 2019-05-22 DIAGNOSIS — F418 Other specified anxiety disorders: Secondary | ICD-10-CM | POA: Diagnosis not present

## 2019-06-19 DIAGNOSIS — M5136 Other intervertebral disc degeneration, lumbar region: Secondary | ICD-10-CM | POA: Diagnosis not present

## 2019-06-19 DIAGNOSIS — M545 Low back pain: Secondary | ICD-10-CM | POA: Diagnosis not present

## 2019-09-19 DIAGNOSIS — E559 Vitamin D deficiency, unspecified: Secondary | ICD-10-CM | POA: Diagnosis not present

## 2019-09-19 DIAGNOSIS — R7309 Other abnormal glucose: Secondary | ICD-10-CM | POA: Diagnosis not present

## 2019-09-19 DIAGNOSIS — E669 Obesity, unspecified: Secondary | ICD-10-CM | POA: Diagnosis not present

## 2019-09-19 DIAGNOSIS — L989 Disorder of the skin and subcutaneous tissue, unspecified: Secondary | ICD-10-CM | POA: Diagnosis not present

## 2019-09-19 DIAGNOSIS — E039 Hypothyroidism, unspecified: Secondary | ICD-10-CM | POA: Diagnosis not present

## 2019-09-19 DIAGNOSIS — I700 Atherosclerosis of aorta: Secondary | ICD-10-CM | POA: Diagnosis not present

## 2019-09-19 DIAGNOSIS — F329 Major depressive disorder, single episode, unspecified: Secondary | ICD-10-CM | POA: Diagnosis not present

## 2019-09-19 DIAGNOSIS — M545 Low back pain: Secondary | ICD-10-CM | POA: Diagnosis not present

## 2019-09-19 DIAGNOSIS — K760 Fatty (change of) liver, not elsewhere classified: Secondary | ICD-10-CM | POA: Diagnosis not present

## 2019-09-19 DIAGNOSIS — E7849 Other hyperlipidemia: Secondary | ICD-10-CM | POA: Diagnosis not present

## 2019-12-16 DIAGNOSIS — M5136 Other intervertebral disc degeneration, lumbar region: Secondary | ICD-10-CM | POA: Diagnosis not present

## 2019-12-16 DIAGNOSIS — M5416 Radiculopathy, lumbar region: Secondary | ICD-10-CM | POA: Diagnosis not present

## 2020-03-11 DIAGNOSIS — M791 Myalgia, unspecified site: Secondary | ICD-10-CM | POA: Diagnosis not present

## 2020-03-11 DIAGNOSIS — M5136 Other intervertebral disc degeneration, lumbar region: Secondary | ICD-10-CM | POA: Diagnosis not present

## 2020-06-09 DIAGNOSIS — M5136 Other intervertebral disc degeneration, lumbar region: Secondary | ICD-10-CM | POA: Diagnosis not present

## 2020-06-09 DIAGNOSIS — M791 Myalgia, unspecified site: Secondary | ICD-10-CM | POA: Diagnosis not present

## 2020-06-30 DIAGNOSIS — E669 Obesity, unspecified: Secondary | ICD-10-CM | POA: Diagnosis not present

## 2020-06-30 DIAGNOSIS — R7303 Prediabetes: Secondary | ICD-10-CM | POA: Diagnosis not present

## 2020-06-30 DIAGNOSIS — I700 Atherosclerosis of aorta: Secondary | ICD-10-CM | POA: Diagnosis not present

## 2020-06-30 DIAGNOSIS — E559 Vitamin D deficiency, unspecified: Secondary | ICD-10-CM | POA: Diagnosis not present

## 2020-06-30 DIAGNOSIS — K760 Fatty (change of) liver, not elsewhere classified: Secondary | ICD-10-CM | POA: Diagnosis not present

## 2020-06-30 DIAGNOSIS — R911 Solitary pulmonary nodule: Secondary | ICD-10-CM | POA: Diagnosis not present

## 2020-06-30 DIAGNOSIS — E785 Hyperlipidemia, unspecified: Secondary | ICD-10-CM | POA: Diagnosis not present

## 2020-06-30 DIAGNOSIS — E039 Hypothyroidism, unspecified: Secondary | ICD-10-CM | POA: Diagnosis not present

## 2020-06-30 DIAGNOSIS — G894 Chronic pain syndrome: Secondary | ICD-10-CM | POA: Diagnosis not present

## 2020-07-28 ENCOUNTER — Emergency Department (HOSPITAL_COMMUNITY): Payer: Medicare HMO

## 2020-07-28 ENCOUNTER — Other Ambulatory Visit: Payer: Self-pay

## 2020-07-28 ENCOUNTER — Emergency Department (HOSPITAL_COMMUNITY)
Admission: EM | Admit: 2020-07-28 | Discharge: 2020-07-29 | Disposition: A | Discharge status: Home / Self Care | Payer: Medicare HMO | Attending: Emergency Medicine | Admitting: Emergency Medicine

## 2020-07-28 DIAGNOSIS — G894 Chronic pain syndrome: Secondary | ICD-10-CM | POA: Diagnosis not present

## 2020-07-28 DIAGNOSIS — M549 Dorsalgia, unspecified: Secondary | ICD-10-CM | POA: Diagnosis not present

## 2020-07-28 DIAGNOSIS — E039 Hypothyroidism, unspecified: Secondary | ICD-10-CM | POA: Insufficient documentation

## 2020-07-28 DIAGNOSIS — R1011 Right upper quadrant pain: Secondary | ICD-10-CM

## 2020-07-28 DIAGNOSIS — D72829 Elevated white blood cell count, unspecified: Secondary | ICD-10-CM | POA: Insufficient documentation

## 2020-07-28 DIAGNOSIS — R1031 Right lower quadrant pain: Secondary | ICD-10-CM | POA: Diagnosis not present

## 2020-07-28 DIAGNOSIS — R1110 Vomiting, unspecified: Secondary | ICD-10-CM | POA: Diagnosis not present

## 2020-07-28 DIAGNOSIS — R109 Unspecified abdominal pain: Secondary | ICD-10-CM

## 2020-07-28 DIAGNOSIS — F17210 Nicotine dependence, cigarettes, uncomplicated: Secondary | ICD-10-CM | POA: Insufficient documentation

## 2020-07-28 DIAGNOSIS — E785 Hyperlipidemia, unspecified: Secondary | ICD-10-CM | POA: Diagnosis not present

## 2020-07-28 DIAGNOSIS — Z79899 Other long term (current) drug therapy: Secondary | ICD-10-CM | POA: Diagnosis not present

## 2020-07-28 DIAGNOSIS — R110 Nausea: Secondary | ICD-10-CM | POA: Diagnosis not present

## 2020-07-28 DIAGNOSIS — K760 Fatty (change of) liver, not elsewhere classified: Secondary | ICD-10-CM | POA: Diagnosis not present

## 2020-07-28 DIAGNOSIS — I700 Atherosclerosis of aorta: Secondary | ICD-10-CM | POA: Diagnosis not present

## 2020-07-28 DIAGNOSIS — R112 Nausea with vomiting, unspecified: Secondary | ICD-10-CM

## 2020-07-28 LAB — URINALYSIS, ROUTINE W REFLEX MICROSCOPIC
Bilirubin Urine: NEGATIVE
Glucose, UA: NEGATIVE mg/dL
Ketones, ur: 80 mg/dL — AB
Leukocytes,Ua: NEGATIVE
Nitrite: NEGATIVE
Protein, ur: 30 mg/dL — AB
Specific Gravity, Urine: 1.027 (ref 1.005–1.030)
pH: 5 (ref 5.0–8.0)

## 2020-07-28 LAB — COMPREHENSIVE METABOLIC PANEL
ALT: 15 U/L (ref 0–44)
AST: 19 U/L (ref 15–41)
Albumin: 4.5 g/dL (ref 3.5–5.0)
Alkaline Phosphatase: 83 U/L (ref 38–126)
Anion gap: 11 (ref 5–15)
BUN: 15 mg/dL (ref 8–23)
CO2: 27 mmol/L (ref 22–32)
Calcium: 10.4 mg/dL — ABNORMAL HIGH (ref 8.9–10.3)
Chloride: 98 mmol/L (ref 98–111)
Creatinine, Ser: 0.73 mg/dL (ref 0.44–1.00)
GFR, Estimated: >60 mL/min (ref >60)
Glucose, Bld: 120 mg/dL — ABNORMAL HIGH (ref 70–99)
Potassium: 4.3 mmol/L (ref 3.5–5.1)
Sodium: 136 mmol/L (ref 135–145)
Total Bilirubin: 0.5 mg/dL (ref 0.3–1.2)
Total Protein: 7.9 g/dL (ref 6.5–8.1)

## 2020-07-28 LAB — CBC
HCT: 44 % (ref 36.0–46.0)
Hemoglobin: 14.7 g/dL (ref 12.0–15.0)
MCH: 30.8 pg (ref 26.0–34.0)
MCHC: 33.4 g/dL (ref 30.0–36.0)
MCV: 92.2 fL (ref 80.0–100.0)
Platelets: 339 10*3/uL (ref 150–400)
RBC: 4.77 MIL/uL (ref 3.87–5.11)
RDW: 12.4 % (ref 11.5–15.5)
WBC: 11.3 10*3/uL — ABNORMAL HIGH (ref 4.0–10.5)
nRBC: 0 % (ref 0.0–0.2)

## 2020-07-28 LAB — LIPASE, BLOOD: Lipase: 23 U/L (ref 11–51)

## 2020-07-28 MED ORDER — ONDANSETRON 4 MG PO TBDP
4.0000 mg | ORAL_TABLET | Freq: Once | ORAL | Status: AC
  Administered 2020-07-28: 4 mg via ORAL
  Filled 2020-07-28: qty 1

## 2020-07-28 MED ORDER — ONDANSETRON HCL 4 MG/2ML IJ SOLN
4.0000 mg | Freq: Once | INTRAMUSCULAR | Status: AC
  Administered 2020-07-29: 4 mg via INTRAVENOUS
  Filled 2020-07-28: qty 2

## 2020-07-28 MED ORDER — SODIUM CHLORIDE 0.9 % IV BOLUS
1000.0000 mL | Freq: Once | INTRAVENOUS | Status: AC
Start: 2020-07-28 — End: 2020-07-29
  Administered 2020-07-29: 1000 mL via INTRAVENOUS

## 2020-07-28 MED ORDER — MORPHINE SULFATE (PF) 4 MG/ML IV SOLN
4.0000 mg | Freq: Once | INTRAVENOUS | Status: AC
  Administered 2020-07-29: 4 mg via INTRAVENOUS
  Filled 2020-07-28: qty 1

## 2020-07-28 MED ORDER — HYDROCODONE-ACETAMINOPHEN 5-325 MG PO TABS
1.0000 | ORAL_TABLET | Freq: Once | ORAL | Status: AC
  Administered 2020-07-28: 1 via ORAL
  Filled 2020-07-28: qty 1

## 2020-07-28 MED ORDER — ONDANSETRON 4 MG PO TBDP: 4.0000 mg | ORAL_TABLET | Freq: Once | ORAL | Status: DC | PRN

## 2020-07-28 NOTE — ED Provider Notes (Signed)
[facility]   CSN: [id number] Arrival date & time: 07/28/20  1231     History Chief Complaint  Patient presents with   Abdominal Pain   Emesis    Crystal SloopMarian S Kirby is a 65 y.o. female.  HPI     This is a 65 year old female with history of hypercholesterolemia, hypothyroidism who presents with abdominal and flank pain.  Patient reports she has had right-sided back and abdominal pain over the last 2 to 3 days.  It was acute in onset.  She states that it is constant and sharp.  It radiates from her right back into her right abdomen.  She has been unable to keep anything down.  She reports multiple episodes of nonbilious, nonbloody emesis.  No changes in bowel movements.  No hematuria or dysuria.  She has not had any fevers.  She rates her pain at "12 out of 10."  Past Medical History:  Diagnosis Date   Hypercholesteremia    Hypersomnia    Hypothyroid    Narcolepsy     Patient Active Problem List   Diagnosis Date Noted   Hypothyroid 10/04/2011   HYPERSOMNIA, IDIOPATHIC 08/14/2008    Past Surgical History:  Procedure Laterality Date   NASAL SEPTOPLASTY W/ TURBINOPLASTY     NASAL SINUS SURGERY     TUBAL LIGATION       OB History   No obstetric history on file.     Family History  Problem Relation Age of Onset   Emphysema Father    Colon cancer Other        multiple maternal aunts/uncles    Social History   Tobacco Use   Smoking status: Every Day    Packs/day: 0.50    Years: 25.00    Pack years: 12.50    Types: Cigarettes  Substance Use Topics   Alcohol use: Yes   Drug use: No    Home Medications Prior to Admission medications   Medication Sig Start Date End Date Taking? Authorizing Provider  ALPRAZolam Prudy Feeler(XANAX) 1 MG tablet Take 1 mg by mouth 3 (three) times daily as needed for anxiety. 06/30/20  Yes [provider]  buPROPion (WELLBUTRIN XL) 300 MG 24 hr tablet Take 300 mg by mouth daily.  07/25/20  Yes [provider]  HYDROcodone-acetaminophen (NORCO/VICODIN) 5-325 MG tablet Take 1 tablet by mouth every 6 (six) hours as needed for pain. 07/08/20  Yes [provider]  LEVOXYL 175 MCG tablet Take 1 tablet by mouth daily. 08/07/10  Yes [provider]  Multiple Vitamin (MULTIVITAMIN) tablet Take 1 tablet by mouth daily.     Yes [provider]  ondansetron (ZOFRAN ODT) 4 MG disintegrating tablet Take 1 tablet (4 mg total) by mouth every 8 (eight) hours as needed for nausea or vomiting. 07/29/20  Yes Kaysee Hergert, Mayer Maskerourtney F, MD  PARoxetine (PAXIL) 20 MG tablet TAKE 1 TABLET BY MOUTH 2 TIMES DAILY. Patient taking differently: Take 40 mg by mouth daily. 08/08/11  Yes Cassell ClementBrackbill, Thomas, MD  rosuvastatin (CRESTOR) 10 MG tablet Take 10 mg by mouth daily. 05/15/20  Yes [provider]  ALPRAZolam Prudy Feeler(XANAX) 0.5 MG tablet Take 1 tablet (0.5 mg total) by mouth 4 (four) times daily as needed for anxiety. For anxiety Patient not taking: No sig reported 09/27/12   Cassell ClementBrackbill, Thomas, MD  amphetamine-dextroamphetamine (ADDERALL XR) 30 MG 24 hr capsule Take 1 capsule (30 mg total) by mouth 3 (three) times daily as needed. Patient not  taking: Reported on 07/29/2020 01/07/13 01/07/14  Waymon Budge, MD    Allergies    Promethazine hcl  Review of Systems   Review of Systems  Constitutional:  Negative for fever.  Respiratory:  Negative for shortness of breath.   Cardiovascular:  Negative for chest pain.  Gastrointestinal:  Positive for abdominal pain, nausea and vomiting. Negative for constipation and diarrhea.  Genitourinary:  Negative for dysuria and hematuria.  All other systems reviewed and are negative.  Physical Exam Updated Vital Signs BP (!) 184/95   Pulse 83   Temp 98.2 F (36.8 C) (Oral)   Resp 19   SpO2 94%   Physical Exam Vitals and nursing note reviewed.  Constitutional:      Appearance: She is well-developed.  HENT:     Head: Normocephalic and  atraumatic.  Eyes:     Pupils: Pupils are equal, round, and reactive to light.  Cardiovascular:     Rate and Rhythm: Normal rate and regular rhythm.     Heart sounds: Normal heart sounds.  Pulmonary:     Effort: Pulmonary effort is normal. No respiratory distress.     Breath sounds: No wheezing.  Abdominal:     General: Bowel sounds are normal.     Palpations: Abdomen is soft.     Tenderness: There is abdominal tenderness in the right upper quadrant and epigastric area.  Musculoskeletal:     Cervical back: Neck supple.  Skin:    General: Skin is warm and dry.  Neurological:     Mental Status: She is alert and oriented to person, place, and time.  Psychiatric:        Mood and Affect: Mood normal.    ED Results / Procedures / Treatments   Labs (all labs ordered are listed, but only abnormal results are displayed) Labs Reviewed  COMPREHENSIVE METABOLIC PANEL - Abnormal; Notable for the following components:      Result Value   Glucose, Bld 120 (*)    Calcium 10.4 (*)    All other components within normal limits  CBC - Abnormal; Notable for the following components:   WBC 11.3 (*)    All other components within normal limits  URINALYSIS, ROUTINE W REFLEX MICROSCOPIC - Abnormal; Notable for the following components:   Color, Urine AMBER (*)    APPearance CLOUDY (*)    Hgb urine dipstick SMALL (*)    Ketones, ur 80 (*)    Protein, ur 30 (*)    Bacteria, UA RARE (*)    All other components within normal limits  LIPASE, BLOOD    EKG None  Radiology CT ABDOMEN PELVIS W CONTRAST  Result Date: 07/29/2020 CLINICAL DATA:  65 year old female with nausea and vomiting. EXAM: CT ABDOMEN AND PELVIS WITH CONTRAST TECHNIQUE: Multidetector CT imaging of the abdomen and pelvis was performed using the standard protocol following bolus administration of intravenous contrast. CONTRAST:  OMNIPAQUE IOHEXOL 300 MG/ML  SOLN COMPARISON:  Noncontrast CT Abdomen and Pelvis and abdomen  ultrasound 07/28/2020. Prior CT 05/14/2011. FINDINGS: Lower chest: A small right lower lobe 8 mm lung nodule on series 5, image 2 is stable since 2013 and benign. Mild elevation of the right hemidiaphragm and mild associated right lung base atelectasis. No pericardial or pleural effusion. Some calcified left coronary artery atherosclerosis is evident. Hepatobiliary: Negative liver and gallbladder. Pancreas: Negative. Spleen: Negative. Adrenals/Urinary Tract: Mild adrenal gland thickening such as due to adrenal hyperplasia, not significantly changed from 2013. No discrete adrenal mass.  Bilateral renal enhancement and contrast excretion is symmetric and normal. No nephrolithiasis on the earlier noncontrast CT. Decompressed ureters. Diminutive and unremarkable bladder. Incidental pelvic phleboliths. Stomach/Bowel: Large bowel is redundant in the pelvis, and the cecum is partially located in the pelvis near midline. Appendix is normal arising on series 3, image 58 and terminating in the midline on coronal image 43. No large bowel inflammation. No dilated small bowel. Stomach and duodenum are decompressed and within normal limits. No free air, free fluid, mesenteric inflammation. There is a smooth round 14 mm dystrophic calcification in the greater omentum on series 3, image 49, unchanged from 2013. Vascular/Lymphatic: Aortoiliac calcified atherosclerosis. Major arterial structures are patent. Portal venous system is patent. No lymphadenopathy. Reproductive: Negative. Other: No pelvic free fluid. Musculoskeletal: Widespread chronic disc and endplate degeneration in the spine. Grade 1 anterolisthesis at the lumbosacral junction. Mild thoracolumbar scoliosis. No acute osseous abnormality identified. IMPRESSION: 1. No acute or inflammatory process identified in the abdomen or pelvis. Normal appendix. 2. Aortic Atherosclerosis (ICD10-I70.0). Electronically Signed   By: Odessa Fleming M.D.   On: 07/29/2020 04:39   CT Renal Stone  Study  Result Date: 07/28/2020 CLINICAL DATA:  Flank pain, kidney stone suspected. Right side flank pain, nausea EXAM: CT ABDOMEN AND PELVIS WITHOUT CONTRAST TECHNIQUE: Multidetector CT imaging of the abdomen and pelvis was performed following the standard protocol without IV contrast. COMPARISON:  05/14/2011 FINDINGS: Lower chest: Lung bases are clear. No effusions. Heart is normal size. Hepatobiliary: No focal hepatic abnormality. Gallbladder unremarkable. Pancreas: No focal abnormality or ductal dilatation. Spleen: No focal abnormality.  Normal size. Adrenals/Urinary Tract: Bilateral adrenal fullness, likely hyperplasia. No renal or ureteral stones. No hydronephrosis. Urinary bladder decompressed, unremarkable. Stomach/Bowel: Stomach, large and small bowel grossly unremarkable. Vascular/Lymphatic: Aortoiliac atherosclerosis. No evidence of aneurysm or adenopathy. Reproductive: Uterus and adnexa unremarkable.  No mass. Other: No free fluid or free air. Musculoskeletal: No acute bony abnormality. IMPRESSION: No renal or ureteral stones.  No hydronephrosis. Aortic atherosclerosis. No acute findings in the abdomen or pelvis. Electronically Signed   By: Charlett Nose M.D.   On: 07/28/2020 16:14   US Abdomen Limited RUQ (LIVER/GB)  Result Date: 07/29/2020 CLINICAL DATA:  Initial evaluation for acute abdominal pain. EXAM: ULTRASOUND ABDOMEN LIMITED RIGHT UPPER QUADRANT COMPARISON:  CT from earlier the same day. FINDINGS: Gallbladder: No gallstones or wall thickening visualized. No sonographic Murphy sign noted by sonographer. Common bile duct: Diameter: 5 mm Liver: No focal lesion identified. Within normal limits in parenchymal echogenicity. Portal vein is patent on color Doppler imaging with normal direction of blood flow towards the liver. Other: None. IMPRESSION: Normal right upper quadrant ultrasound. Electronically Signed   By: Rise Mu M.D.   On: 07/29/2020 00:09    Procedures Procedures    Medications Ordered in ED Medications  ondansetron (ZOFRAN-ODT) disintegrating tablet 4 mg (4 mg Oral Given 07/28/20 1328)  HYDROcodone-acetaminophen (NORCO/VICODIN) 5-325 MG per tablet 1 tablet (1 tablet Oral Given 07/28/20 1354)  sodium chloride 0.9 % bolus 1,000 mL (0 mLs Intravenous Stopped 07/29/20 0210)  ondansetron (ZOFRAN) injection 4 mg (4 mg Intravenous Given 07/29/20 0013)  morphine 4 MG/ML injection 4 mg (4 mg Intravenous Given 07/29/20 0013)  morphine 4 MG/ML injection 4 mg (4 mg Intravenous Given 07/29/20 0218)  cyclobenzaprine (FLEXERIL) tablet 5 mg (5 mg Oral Given 07/29/20 0218)  iohexol (OMNIPAQUE) 300 MG/ML solution 100 mL (100 mLs Intravenous Contrast Given 07/29/20 0243)    ED Course  I have reviewed the triage  vital signs and the nursing notes.  Pertinent labs & imaging results that were available during my care of the patient were reviewed by me and considered in my medical decision making (see chart for details).  Clinical Course as of 07/29/20 0604  Tue Jul 28, 2020  2300 CT Renal [facility] [place] [MG]  Wed Jul 29, 2020  0157 Patient with ongoing right flank and abdominal pain.  Noncontrasted CT scan did not reveal any acute abnormality.  Right upper quadrant ultrasound was negative for acute cholecystitis or gallbladder changes.  While noncontrasted scan would catch most things, things such as renal infarction or other vascular abnormalities could be missed.  Given ongoing pain, will reCT with contrast. [CH]    Clinical Course User Index [CH] Laurabelle Gorczyca, Mayer Masker, MD [MG] Jesse Fall   MDM Rules/Calculators/A&P                           Patient presents with abdominal pain, back pain, nausea, vomiting.  She is overall nontoxic.  She does appear uncomfortable.  She was evaluated in triage.  Labs reviewed and largely reassuring.  She does have some evidence of dehydration with ketones in the urine.  No LFT or lipase derangement.  Slight leukocytosis.   Considerations include but not limited to, kidney stone, pyelonephritis, appendicitis, cholecystitis.  Urinalysis not consistent with UTI.  CT scan ordered from triage reviewed and is largely reassuring.  No evidence of kidney stone.  Given ongoing pain and obvious discomfort, right upper quadrant ultrasound was obtained.  This shows no evidence of cholecystitis.  On multiple rechecks, patient continued to complain of ongoing pain and vomiting.  I have confirmed there is no obvious rash to suggest shingles.  Her initial CT scan as well as without contrast.  This could miss potential vascular abnormalities and/or infarct.  For this reason, repeat CT was obtained with contrast.  CT scan reviewed.  No evidence of appendicitis.  No evidence of renal infarct.  Patient was p.o. challenged.  She does have some ongoing discomfort.  Discussed with her the possibility of prodrome to shingles.  She is to monitor closely.  Will discharge home with Zofran.  She has hydrocodone at home for pain.  Follow-up with primary physician if not improving  After history, exam, and medical workup I feel the patient has been appropriately medically screened and is safe for discharge home. Pertinent diagnoses were discussed with the patient. Patient was given return precautions.  Final Clinical Impression(s) / ED Diagnoses Final diagnoses:  Abdominal pain  Right upper quadrant abdominal pain  Non-intractable vomiting with nausea, unspecified vomiting type    Rx / DC Orders ED Discharge Orders          Ordered    ondansetron (ZOFRAN ODT) 4 MG disintegrating tablet  Every 8 hours PRN        07/29/20 0603             Shon Baton, MD 07/29/20 [phone]

## 2020-07-28 NOTE — ED Triage Notes (Signed)
Pt from PCP for eval of two days of n/v with R flank and R abdominal pain. No diarrhea. Unable to tolerate PO intake, has vomited 17 times in the last 24 hours.

## 2020-07-28 NOTE — ED Provider Notes (Signed)
Emergency Medicine Provider Triage Evaluation Note  HARVEST DEIST , a 65 y.o. female  was evaluated in triage.  Pt complains of right abdominal pain x2 days.  She also endorses nausea and emesis.  Went to PCPs office prior to arrival and they sent her here.  Denies history of kidney stones.  Admits to vomiting 17 times in the last 24 hours..  Review of Systems  Positive: Abdominal pain, right-sided back pain, nausea, emesis Negative: Fever, chills, dysuria, urinary frequency, gross hematuria  Physical Exam  BP (!) 198/100 (BP Location: Right Arm)   Pulse 75   Temp 98.3 F (36.8 C) (Oral)   Resp 18   SpO2 97%  Gen:   Awake, no distress   Resp:  Normal effort  MSK:   Moves extremities without difficulty  Other:             right upper and lower abdominal tenderness, right CVA tenderness.  No peritoneal signs.  Medical Decision Making  Medically screening exam initiated at 1:36 PM.  Appropriate orders placed.  DANIEL JOHNDROW was informed that the remainder of the evaluation will be completed by another provider, this initial triage assessment does not replace that evaluation, and the importance of remaining in the ED until their evaluation is complete.  Abdominal labs and UA ordered.  DDx includes kidney stone, CT and pelvis ordered.  Patient given Zofran and Norco.   Portions of this note were generated with Scientist, clinical (histocompatibility and immunogenetics). Dictation errors may occur despite best attempts at proofreading.    Shanon Ace, PA-C 07/28/20 1339    Cathren Laine, MD 07/30/20 1316

## 2020-07-29 ENCOUNTER — Emergency Department (HOSPITAL_COMMUNITY): Payer: Medicare HMO

## 2020-07-29 DIAGNOSIS — R1110 Vomiting, unspecified: Secondary | ICD-10-CM | POA: Diagnosis not present

## 2020-07-29 MED ORDER — CYCLOBENZAPRINE HCL 10 MG PO TABS
5.0000 mg | ORAL_TABLET | Freq: Once | ORAL | Status: AC
  Administered 2020-07-29: 5 mg via ORAL
  Filled 2020-07-29: qty 1

## 2020-07-29 MED ORDER — ONDANSETRON 4 MG PO TBDP: 4.0000 mg | ORAL_TABLET | Freq: Three times a day (TID) | ORAL | 0 refills | Status: DC | PRN

## 2020-07-29 MED ORDER — IOHEXOL 300 MG/ML  SOLN
100.0000 mL | Freq: Once | INTRAMUSCULAR | Status: AC | PRN
  Administered 2020-07-29: 100 mL via INTRAVENOUS

## 2020-07-29 MED ORDER — ONDANSETRON 4 MG PO TBDP
4.0000 mg | ORAL_TABLET | Freq: Once | ORAL | Status: AC
  Administered 2020-07-29: 4 mg via ORAL
  Filled 2020-07-29: qty 1

## 2020-07-29 MED ORDER — MORPHINE SULFATE (PF) 4 MG/ML IV SOLN
4.0000 mg | Freq: Once | INTRAVENOUS | Status: AC
  Administered 2020-07-29: 4 mg via INTRAVENOUS
  Filled 2020-07-29: qty 1

## 2020-07-29 NOTE — Discharge Instructions (Addendum)
You were seen today for nausea, vomiting.  Your work-up was reassuring including CT scans and ultrasound.  Your lab work-up is normal.  Take nausea medication as prescribed.  You may take your home hydrocodone for pain.  If you develop rash, you need to be seen immediately as sometimes you can have pain as a prodrome for shingles.

## 2020-07-29 NOTE — ED Notes (Signed)
Patient transported to CT 

## 2020-09-08 DIAGNOSIS — R030 Elevated blood-pressure reading, without diagnosis of hypertension: Secondary | ICD-10-CM | POA: Diagnosis not present

## 2020-09-08 DIAGNOSIS — M5136 Other intervertebral disc degeneration, lumbar region: Secondary | ICD-10-CM | POA: Diagnosis not present

## 2020-09-08 DIAGNOSIS — Z6833 Body mass index (BMI) 33.0-33.9, adult: Secondary | ICD-10-CM | POA: Diagnosis not present

## 2020-12-04 DIAGNOSIS — F1120 Opioid dependence, uncomplicated: Secondary | ICD-10-CM | POA: Diagnosis not present

## 2020-12-04 DIAGNOSIS — M5136 Other intervertebral disc degeneration, lumbar region: Secondary | ICD-10-CM | POA: Diagnosis not present

## 2020-12-04 DIAGNOSIS — M791 Myalgia, unspecified site: Secondary | ICD-10-CM | POA: Diagnosis not present

## 2021-02-22 DIAGNOSIS — M5136 Other intervertebral disc degeneration, lumbar region: Secondary | ICD-10-CM | POA: Diagnosis not present

## 2021-02-22 DIAGNOSIS — M791 Myalgia, unspecified site: Secondary | ICD-10-CM | POA: Diagnosis not present

## 2021-02-22 DIAGNOSIS — F1120 Opioid dependence, uncomplicated: Secondary | ICD-10-CM | POA: Diagnosis not present

## 2021-02-22 DIAGNOSIS — M5417 Radiculopathy, lumbosacral region: Secondary | ICD-10-CM | POA: Diagnosis not present

## 2021-02-25 DIAGNOSIS — E039 Hypothyroidism, unspecified: Secondary | ICD-10-CM | POA: Diagnosis not present

## 2021-02-25 DIAGNOSIS — R7303 Prediabetes: Secondary | ICD-10-CM | POA: Diagnosis not present

## 2021-02-25 DIAGNOSIS — I2584 Coronary atherosclerosis due to calcified coronary lesion: Secondary | ICD-10-CM | POA: Diagnosis not present

## 2021-02-25 DIAGNOSIS — E785 Hyperlipidemia, unspecified: Secondary | ICD-10-CM | POA: Diagnosis not present

## 2021-02-25 DIAGNOSIS — R911 Solitary pulmonary nodule: Secondary | ICD-10-CM | POA: Diagnosis not present

## 2021-02-25 DIAGNOSIS — K760 Fatty (change of) liver, not elsewhere classified: Secondary | ICD-10-CM | POA: Diagnosis not present

## 2021-02-25 DIAGNOSIS — G894 Chronic pain syndrome: Secondary | ICD-10-CM | POA: Diagnosis not present

## 2021-02-25 DIAGNOSIS — I700 Atherosclerosis of aorta: Secondary | ICD-10-CM | POA: Diagnosis not present

## 2021-02-25 DIAGNOSIS — F330 Major depressive disorder, recurrent, mild: Secondary | ICD-10-CM | POA: Diagnosis not present

## 2021-02-25 DIAGNOSIS — F17210 Nicotine dependence, cigarettes, uncomplicated: Secondary | ICD-10-CM | POA: Diagnosis not present

## 2021-02-25 DIAGNOSIS — H04129 Dry eye syndrome of unspecified lacrimal gland: Secondary | ICD-10-CM | POA: Diagnosis not present

## 2021-02-25 DIAGNOSIS — H0520 Unspecified exophthalmos: Secondary | ICD-10-CM | POA: Diagnosis not present

## 2021-02-25 DIAGNOSIS — E559 Vitamin D deficiency, unspecified: Secondary | ICD-10-CM | POA: Diagnosis not present

## 2021-03-18 DIAGNOSIS — M5417 Radiculopathy, lumbosacral region: Secondary | ICD-10-CM | POA: Diagnosis not present

## 2021-03-18 DIAGNOSIS — Z7689 Persons encountering health services in other specified circumstances: Secondary | ICD-10-CM | POA: Diagnosis not present

## 2021-03-18 DIAGNOSIS — R29898 Other symptoms and signs involving the musculoskeletal system: Secondary | ICD-10-CM | POA: Diagnosis not present

## 2021-03-18 DIAGNOSIS — M2578 Osteophyte, vertebrae: Secondary | ICD-10-CM | POA: Diagnosis not present

## 2021-03-18 DIAGNOSIS — M545 Low back pain, unspecified: Secondary | ICD-10-CM | POA: Diagnosis not present

## 2021-03-18 DIAGNOSIS — M47816 Spondylosis without myelopathy or radiculopathy, lumbar region: Secondary | ICD-10-CM | POA: Diagnosis not present

## 2021-05-14 DIAGNOSIS — F1120 Opioid dependence, uncomplicated: Secondary | ICD-10-CM | POA: Diagnosis not present

## 2021-05-14 DIAGNOSIS — M791 Myalgia, unspecified site: Secondary | ICD-10-CM | POA: Diagnosis not present

## 2021-05-14 DIAGNOSIS — M5136 Other intervertebral disc degeneration, lumbar region: Secondary | ICD-10-CM | POA: Diagnosis not present

## 2021-05-14 DIAGNOSIS — M5417 Radiculopathy, lumbosacral region: Secondary | ICD-10-CM | POA: Diagnosis not present

## 2021-05-19 ENCOUNTER — Other Ambulatory Visit (HOSPITAL_BASED_OUTPATIENT_CLINIC_OR_DEPARTMENT_OTHER): Payer: Self-pay

## 2021-05-19 MED ORDER — HYDROCODONE-ACETAMINOPHEN 7.5-325 MG PO TABS
ORAL_TABLET | ORAL | 0 refills | Status: DC
  Filled 2021-05-19: qty 120, 30d supply, fill #0

## 2021-06-21 ENCOUNTER — Other Ambulatory Visit: Payer: Self-pay | Admitting: Endocrinology

## 2021-06-21 DIAGNOSIS — Z1231 Encounter for screening mammogram for malignant neoplasm of breast: Secondary | ICD-10-CM

## 2021-06-25 ENCOUNTER — Ambulatory Visit
Admission: RE | Admit: 2021-06-25 | Discharge: 2021-06-25 | Disposition: A | Discharge status: Home / Self Care | Payer: Medicare HMO | Source: Ambulatory Visit | Attending: Endocrinology | Admitting: Endocrinology

## 2021-06-25 DIAGNOSIS — Z1231 Encounter for screening mammogram for malignant neoplasm of breast: Secondary | ICD-10-CM

## 2021-07-15 ENCOUNTER — Other Ambulatory Visit (HOSPITAL_BASED_OUTPATIENT_CLINIC_OR_DEPARTMENT_OTHER): Payer: Self-pay

## 2021-07-15 MED ORDER — METHYLPREDNISOLONE 4 MG PO TBPK
ORAL_TABLET | ORAL | 0 refills | Status: DC
  Filled 2021-07-15: qty 21, 6d supply, fill #0

## 2021-08-02 DIAGNOSIS — F330 Major depressive disorder, recurrent, mild: Secondary | ICD-10-CM | POA: Diagnosis not present

## 2021-08-02 DIAGNOSIS — E039 Hypothyroidism, unspecified: Secondary | ICD-10-CM | POA: Diagnosis not present

## 2021-08-02 DIAGNOSIS — R7303 Prediabetes: Secondary | ICD-10-CM | POA: Diagnosis not present

## 2021-08-02 DIAGNOSIS — E785 Hyperlipidemia, unspecified: Secondary | ICD-10-CM | POA: Diagnosis not present

## 2021-08-12 DIAGNOSIS — F1120 Opioid dependence, uncomplicated: Secondary | ICD-10-CM | POA: Diagnosis not present

## 2021-08-12 DIAGNOSIS — Z6834 Body mass index (BMI) 34.0-34.9, adult: Secondary | ICD-10-CM | POA: Diagnosis not present

## 2021-08-12 DIAGNOSIS — M5417 Radiculopathy, lumbosacral region: Secondary | ICD-10-CM | POA: Diagnosis not present

## 2021-08-12 DIAGNOSIS — M5136 Other intervertebral disc degeneration, lumbar region: Secondary | ICD-10-CM | POA: Diagnosis not present

## 2021-10-11 DIAGNOSIS — F17210 Nicotine dependence, cigarettes, uncomplicated: Secondary | ICD-10-CM | POA: Diagnosis not present

## 2021-10-11 DIAGNOSIS — Z0000 Encounter for general adult medical examination without abnormal findings: Secondary | ICD-10-CM | POA: Diagnosis not present

## 2021-10-11 DIAGNOSIS — F330 Major depressive disorder, recurrent, mild: Secondary | ICD-10-CM | POA: Diagnosis not present

## 2021-10-11 DIAGNOSIS — E785 Hyperlipidemia, unspecified: Secondary | ICD-10-CM | POA: Diagnosis not present

## 2021-10-11 DIAGNOSIS — K760 Fatty (change of) liver, not elsewhere classified: Secondary | ICD-10-CM | POA: Diagnosis not present

## 2021-10-11 DIAGNOSIS — E039 Hypothyroidism, unspecified: Secondary | ICD-10-CM | POA: Diagnosis not present

## 2021-10-11 DIAGNOSIS — I700 Atherosclerosis of aorta: Secondary | ICD-10-CM | POA: Diagnosis not present

## 2021-10-11 DIAGNOSIS — I2584 Coronary atherosclerosis due to calcified coronary lesion: Secondary | ICD-10-CM | POA: Diagnosis not present

## 2021-10-11 DIAGNOSIS — F418 Other specified anxiety disorders: Secondary | ICD-10-CM | POA: Diagnosis not present

## 2021-10-11 DIAGNOSIS — E559 Vitamin D deficiency, unspecified: Secondary | ICD-10-CM | POA: Diagnosis not present

## 2021-10-11 DIAGNOSIS — R7303 Prediabetes: Secondary | ICD-10-CM | POA: Diagnosis not present

## 2021-10-11 DIAGNOSIS — G894 Chronic pain syndrome: Secondary | ICD-10-CM | POA: Diagnosis not present

## 2021-10-11 DIAGNOSIS — E669 Obesity, unspecified: Secondary | ICD-10-CM | POA: Diagnosis not present

## 2021-10-21 DIAGNOSIS — R509 Fever, unspecified: Secondary | ICD-10-CM | POA: Diagnosis not present

## 2021-10-21 DIAGNOSIS — R7303 Prediabetes: Secondary | ICD-10-CM | POA: Diagnosis not present

## 2021-10-21 DIAGNOSIS — R531 Weakness: Secondary | ICD-10-CM | POA: Diagnosis not present

## 2021-10-21 DIAGNOSIS — K760 Fatty (change of) liver, not elsewhere classified: Secondary | ICD-10-CM | POA: Diagnosis not present

## 2021-10-21 DIAGNOSIS — I700 Atherosclerosis of aorta: Secondary | ICD-10-CM | POA: Diagnosis not present

## 2021-10-21 DIAGNOSIS — I2584 Coronary atherosclerosis due to calcified coronary lesion: Secondary | ICD-10-CM | POA: Diagnosis not present

## 2021-10-21 DIAGNOSIS — E669 Obesity, unspecified: Secondary | ICD-10-CM | POA: Diagnosis not present

## 2021-10-21 DIAGNOSIS — E785 Hyperlipidemia, unspecified: Secondary | ICD-10-CM | POA: Diagnosis not present

## 2021-10-21 DIAGNOSIS — Z1152 Encounter for screening for COVID-19: Secondary | ICD-10-CM | POA: Diagnosis not present

## 2021-10-25 ENCOUNTER — Other Ambulatory Visit: Payer: Self-pay

## 2021-10-25 ENCOUNTER — Encounter (HOSPITAL_COMMUNITY): Payer: Self-pay | Admitting: Emergency Medicine

## 2021-10-25 ENCOUNTER — Emergency Department (HOSPITAL_COMMUNITY)
Admission: EM | Admit: 2021-10-25 | Discharge: 2021-10-26 | Disposition: A | Discharge status: Home / Self Care | Payer: Medicare HMO | Attending: Emergency Medicine | Admitting: Emergency Medicine

## 2021-10-25 DIAGNOSIS — K279 Peptic ulcer, site unspecified, unspecified as acute or chronic, without hemorrhage or perforation: Secondary | ICD-10-CM | POA: Insufficient documentation

## 2021-10-25 DIAGNOSIS — R1013 Epigastric pain: Secondary | ICD-10-CM | POA: Diagnosis not present

## 2021-10-25 DIAGNOSIS — D72829 Elevated white blood cell count, unspecified: Secondary | ICD-10-CM | POA: Diagnosis not present

## 2021-10-25 DIAGNOSIS — R109 Unspecified abdominal pain: Secondary | ICD-10-CM | POA: Diagnosis not present

## 2021-10-25 DIAGNOSIS — R030 Elevated blood-pressure reading, without diagnosis of hypertension: Secondary | ICD-10-CM | POA: Diagnosis not present

## 2021-10-25 DIAGNOSIS — I10 Essential (primary) hypertension: Secondary | ICD-10-CM | POA: Diagnosis not present

## 2021-10-25 DIAGNOSIS — E876 Hypokalemia: Secondary | ICD-10-CM | POA: Diagnosis not present

## 2021-10-25 DIAGNOSIS — I700 Atherosclerosis of aorta: Secondary | ICD-10-CM | POA: Diagnosis not present

## 2021-10-25 DIAGNOSIS — R112 Nausea with vomiting, unspecified: Secondary | ICD-10-CM | POA: Diagnosis present

## 2021-10-25 LAB — COMPREHENSIVE METABOLIC PANEL
ALT: 13 U/L (ref 0–44)
AST: 16 U/L (ref 15–41)
Albumin: 4.2 g/dL (ref 3.5–5.0)
Alkaline Phosphatase: 90 U/L (ref 38–126)
Anion gap: 13 (ref 5–15)
BUN: 12 mg/dL (ref 8–23)
CO2: 24 mmol/L (ref 22–32)
Calcium: 10.5 mg/dL — ABNORMAL HIGH (ref 8.9–10.3)
Chloride: 99 mmol/L (ref 98–111)
Creatinine, Ser: 0.91 mg/dL (ref 0.44–1.00)
GFR, Estimated: >60 mL/min (ref >60)
Glucose, Bld: 109 mg/dL — ABNORMAL HIGH (ref 70–99)
Potassium: 3.4 mmol/L — ABNORMAL LOW (ref 3.5–5.1)
Sodium: 136 mmol/L (ref 135–145)
Total Bilirubin: 0.7 mg/dL (ref 0.3–1.2)
Total Protein: 7.5 g/dL (ref 6.5–8.1)

## 2021-10-25 LAB — CBC
HCT: 39.6 % (ref 36.0–46.0)
Hemoglobin: 12.9 g/dL (ref 12.0–15.0)
MCH: 30.5 pg (ref 26.0–34.0)
MCHC: 32.6 g/dL (ref 30.0–36.0)
MCV: 93.6 fL (ref 80.0–100.0)
Platelets: 445 10*3/uL — ABNORMAL HIGH (ref 150–400)
RBC: 4.23 MIL/uL (ref 3.87–5.11)
RDW: 12.7 % (ref 11.5–15.5)
WBC: 11.3 10*3/uL — ABNORMAL HIGH (ref 4.0–10.5)
nRBC: 0 % (ref 0.0–0.2)

## 2021-10-25 LAB — URINALYSIS, ROUTINE W REFLEX MICROSCOPIC
Bilirubin Urine: NEGATIVE
Glucose, UA: NEGATIVE mg/dL
Hgb urine dipstick: NEGATIVE
Ketones, ur: 20 mg/dL — AB
Nitrite: NEGATIVE
Protein, ur: 100 mg/dL — AB
Specific Gravity, Urine: 1.02 (ref 1.005–1.030)
pH: 5 (ref 5.0–8.0)

## 2021-10-25 LAB — LIPASE, BLOOD: Lipase: 32 U/L (ref 11–51)

## 2021-10-25 NOTE — ED Provider Triage Note (Signed)
Emergency Medicine Provider Triage Evaluation Note  JLEE HARKLESS , a 66 y.o. female  was evaluated in triage.  Pt complains of NVD and abd pain started 4 days.  No etoh per pt. States she's had decreased appetite.   No fevers.  Last BM  yesterday no blood  Review of Systems  Positive: NVD abd pain Negative: Fever   Physical Exam  BP (!) 184/100 (BP Location: Left Arm)   Pulse 99   Temp 98.9 F (37.2 C)   Resp 16   SpO2 100%  Gen:   Awake, no distress   Resp:  Normal effort  MSK:   Moves extremities without difficulty Other:  Abd w mild epigastric and LUQ TTP. No guarding or rebound.   Medical Decision Making  Medically screening exam initiated at 9:18 PM.  Appropriate orders placed.  KENZINGTON MIELKE was informed that the remainder of the evaluation will be completed by another provider, this initial triage assessment does not replace that evaluation, and the importance of remaining in the ED until their evaluation is complete.  Labs.    Pati Gallo [place], [place] 10/25/21 2120

## 2021-10-25 NOTE — ED Triage Notes (Signed)
Patient reports pain across upper abdomen with emesis and diarrhea /fatigue onset last Friday . Denies fever or chills .

## 2021-10-26 ENCOUNTER — Emergency Department (HOSPITAL_COMMUNITY): Payer: Medicare HMO

## 2021-10-26 DIAGNOSIS — I700 Atherosclerosis of aorta: Secondary | ICD-10-CM | POA: Diagnosis not present

## 2021-10-26 DIAGNOSIS — R109 Unspecified abdominal pain: Secondary | ICD-10-CM | POA: Diagnosis not present

## 2021-10-26 MED ORDER — ONDANSETRON HCL 4 MG/2ML IJ SOLN
4.0000 mg | Freq: Once | INTRAMUSCULAR | Status: AC
  Administered 2021-10-26: 4 mg via INTRAVENOUS
  Filled 2021-10-26: qty 2

## 2021-10-26 MED ORDER — MORPHINE SULFATE (PF) 4 MG/ML IV SOLN
4.0000 mg | Freq: Once | INTRAVENOUS | Status: AC
  Administered 2021-10-26: 4 mg via INTRAVENOUS
  Filled 2021-10-26: qty 1

## 2021-10-26 MED ORDER — AMLODIPINE BESYLATE 5 MG PO TABS
5.0000 mg | ORAL_TABLET | Freq: Once | ORAL | Status: AC
  Administered 2021-10-26: 5 mg via ORAL
  Filled 2021-10-26: qty 1

## 2021-10-26 MED ORDER — ONDANSETRON 4 MG PO TBDP
4.0000 mg | ORAL_TABLET | Freq: Once | ORAL | Status: AC
  Administered 2021-10-26: 4 mg via ORAL
  Filled 2021-10-26: qty 1

## 2021-10-26 MED ORDER — AMLODIPINE BESYLATE 5 MG PO TABS: 5.0000 mg | ORAL_TABLET | Freq: Every day | ORAL | 0 refills | Status: AC

## 2021-10-26 MED ORDER — SODIUM CHLORIDE 0.9 % IV BOLUS
1000.0000 mL | Freq: Once | INTRAVENOUS | Status: AC
  Administered 2021-10-26: 1000 mL via INTRAVENOUS

## 2021-10-26 MED ORDER — DICLOFENAC SODIUM 1 % EX GEL: 2.0000 g | Freq: Four times a day (QID) | CUTANEOUS | 0 refills | Status: DC

## 2021-10-26 MED ORDER — LIDOCAINE 5 % EX PTCH
1.0000 | MEDICATED_PATCH | CUTANEOUS | Status: DC
  Administered 2021-10-26: 1 via TRANSDERMAL
  Filled 2021-10-26: qty 1

## 2021-10-26 MED ORDER — IOHEXOL 350 MG/ML SOLN
100.0000 mL | Freq: Once | INTRAVENOUS | Status: AC | PRN
  Administered 2021-10-26: 75 mL via INTRAVENOUS

## 2021-10-26 MED ORDER — PANTOPRAZOLE SODIUM 40 MG PO TBEC: 40.0000 mg | DELAYED_RELEASE_TABLET | Freq: Two times a day (BID) | ORAL | 0 refills | Status: AC

## 2021-10-26 NOTE — Discharge Instructions (Addendum)
Avoid NSAIDs such as Aleve, ibuprofen, Goody powders as these can aggravate a stomach ulcer.  Take Protonix as prescribed. Take amlodipine daily for blood pressure.  Monitor your blood pressure at home.  Record your readings and take to your primary care provider for follow-up. Apply Voltaren gel to back as needed as prescribed for back pain.  Also use over-the-counter lidocaine patches.  Follow-up with GI, call to schedule an appointment.

## 2021-10-26 NOTE — ED Notes (Signed)
Pt verbalized understanding of discharge instructions. Opportunity for questions provided.  

## 2021-10-26 NOTE — ED Provider Notes (Signed)
[facility] EMERGENCY DEPARTMENT Provider Note   CSN: [id number] Arrival date & time: 10/25/21  2022     History  Chief Complaint  Patient presents with   Abdominal Pain    Emesis Crystal Kirby    Crystal Kirby is a 66 y.o. female.   66 year old female presents with complaint of nausea and vomiting for the past 4 days.  States that she has been vomiting so much that she feels like she has pulled something in her left mid back with pain that radiates up towards her shoulder blade, worse with movement and palpation.  Also reports having some watery diarrhea which is nonbloody.  States she had a similar episode earlier this year, was worked up thoroughly and eventually diagnosed with shingles.  She has had 2 shingles vaccines, including most recently on October 1.  States she went to her doctor on Thursday, the day before her symptoms started, was found to have a fever of 99.5 (states her husband's fever was 101), and had a negative COVID test.  States that she was at the doctor for her annual physical and felt well otherwise.       Home Medications Prior to Admission medications   Medication Sig Start Date End Date Taking? Authorizing Provider  amLODipine (NORVASC) 5 MG tablet Take 1 tablet (5 mg total) by mouth daily. 10/26/21  Yes Tacy Learn, PA-C  diclofenac Sodium (VOLTAREN) 1 % GEL Apply 2 g topically 4 (four) times daily. 10/26/21  Yes Tacy Learn, PA-C  pantoprazole (PROTONIX) 40 MG tablet Take 1 tablet (40 mg total) by mouth 2 (two) times daily. 10/26/21 11/25/21 Yes Tacy Learn, PA-C  ALPRAZolam Duanne Moron) 0.5 MG tablet Take 1 tablet (0.5 mg total) by mouth 4 (four) times daily as needed for anxiety. For anxiety Patient not taking: No sig reported 09/27/12   Darlin Coco, MD  ALPRAZolam Duanne Moron) 1 MG tablet Take 1 mg by mouth 3 (three) times daily as needed for anxiety. 06/30/20   [provider]  amphetamine-dextroamphetamine  (ADDERALL XR) 30 MG 24 hr capsule Take 1 capsule (30 mg total) by mouth 3 (three) times daily as needed. Patient not taking: Reported on 07/29/2020 01/07/13 01/07/14  Baird Lyons D, MD  buPROPion (WELLBUTRIN XL) 300 MG 24 hr tablet Take 300 mg by mouth daily. 07/25/20   [provider]  HYDROcodone-acetaminophen (NORCO/VICODIN) 5-325 MG tablet Take 1 tablet by mouth every 6 (six) hours as needed for pain. 07/08/20   [provider]  LEVOXYL 175 MCG tablet Take 1 tablet by mouth daily. 08/07/10   [provider]  Multiple Vitamin (MULTIVITAMIN) tablet Take 1 tablet by mouth daily.      [provider]  ondansetron (ZOFRAN ODT) 4 MG disintegrating tablet Take 1 tablet (4 mg total) by mouth every 8 (eight) hours as needed for nausea or vomiting. 07/29/20   Horton, Barbette Hair, MD  PARoxetine (PAXIL) 20 MG tablet TAKE 1 TABLET BY MOUTH 2 TIMES DAILY. Patient taking differently: Take 40 mg by mouth daily. 08/08/11   Darlin Coco, MD  rosuvastatin (CRESTOR) 10 MG tablet Take 10 mg by mouth daily. 05/15/20   [provider]      Allergies    Promethazine hcl    Review of Systems   Review of Systems Negative except as per HPI Physical Exam Updated Vital Signs BP (!) 202/86 (BP Location: Left Arm)   Pulse 84   Temp 98.2 F (36.8 C) (Oral)  Resp 18   SpO2 97%  Physical Exam Vitals and nursing note reviewed.  Constitutional:      General: She is not in acute distress.    Appearance: She is well-developed. She is not diaphoretic.  HENT:     Head: Normocephalic and atraumatic.  Cardiovascular:     Rate and Rhythm: Normal rate and regular rhythm.     Heart sounds: Normal heart sounds.  Pulmonary:     Effort: Pulmonary effort is normal.     Breath sounds: Normal breath sounds.  Abdominal:     Palpations: Abdomen is soft.     Tenderness: There is abdominal tenderness in the right upper quadrant and epigastric area.  Musculoskeletal:     Right lower  leg: No edema.     Left lower leg: No edema.  Skin:    General: Skin is warm and dry.     Findings: No erythema or rash.  Neurological:     Mental Status: She is alert and oriented to person, place, and time.  Psychiatric:        Behavior: Behavior normal.     ED Results / Procedures / Treatments   Labs (all labs ordered are listed, but only abnormal results are displayed) Labs Reviewed  COMPREHENSIVE METABOLIC PANEL - Abnormal; Notable for the following components:      Result Value   Potassium 3.4 (*)    Glucose, Bld 109 (*)    Calcium 10.5 (*)    All other components within normal limits  CBC - Abnormal; Notable for the following components:   WBC 11.3 (*)    Platelets 445 (*)    All other components within normal limits  URINALYSIS, ROUTINE W REFLEX MICROSCOPIC - Abnormal; Notable for the following components:   Color, Urine AMBER (*)    APPearance CLOUDY (*)    Ketones, ur 20 (*)    Protein, ur 100 (*)    Leukocytes,Ua TRACE (*)    Bacteria, UA RARE (*)    All other components within normal limits  LIPASE, BLOOD    EKG None  Radiology CT Abdomen Pelvis W Contrast  Result Date: 10/26/2021 CLINICAL DATA:  Acute generalized abdominal pain. EXAM: CT ABDOMEN AND PELVIS WITH CONTRAST TECHNIQUE: Multidetector CT imaging of the abdomen and pelvis was performed using the standard protocol following bolus administration of intravenous contrast. RADIATION DOSE REDUCTION: This exam was performed according to the departmental dose-optimization program which includes automated exposure control, adjustment of the mA and/or kV according to patient size and/or use of iterative reconstruction technique. CONTRAST:  54mL OMNIPAQUE IOHEXOL 350 MG/ML SOLN COMPARISON:  July 29, 2020. FINDINGS: Lower chest: No acute abnormality. Hepatobiliary: No focal liver abnormality is seen. No gallstones, gallbladder wall thickening, or biliary dilatation. Pancreas: Unremarkable. No pancreatic ductal  dilatation or surrounding inflammatory changes. Spleen: Normal in size without focal abnormality. Adrenals/Urinary Tract: Adrenal glands are unremarkable. Kidneys are normal, without renal calculi, focal lesion, or hydronephrosis. Bladder is unremarkable. Stomach/Bowel: Stomach is within normal limits. Appendix appears normal. There is no evidence of bowel obstruction. Focal wall thickening and inflammatory changes seen involving the third portion of the duodenum concerning for peptic ulcer disease. Vascular/Lymphatic: Aortic atherosclerosis. No enlarged abdominal or pelvic lymph nodes. Reproductive: Uterus and bilateral adnexa are unremarkable. Other: No abdominal wall hernia or abnormality. No abdominopelvic ascites. Musculoskeletal: No acute or significant osseous findings. IMPRESSION: Focal wall thickening and inflammatory changes are seen involving the third portion of the duodenum concerning for peptic ulcer disease.  Aortic Atherosclerosis (ICD10-I70.0). Electronically Signed   By: Marijo Conception M.D.   On: 10/26/2021 14:21    Procedures Procedures    Medications Ordered in ED Medications  amLODipine (NORVASC) tablet 5 mg (has no administration in time range)  lidocaine (LIDODERM) 5 % 1 patch (has no administration in time range)  ondansetron (ZOFRAN-ODT) disintegrating tablet 4 mg (4 mg Oral Given 10/26/21 0611)  sodium chloride 0.9 % bolus 1,000 mL (0 mLs Intravenous Stopped 10/26/21 1457)  ondansetron (ZOFRAN) injection 4 mg (4 mg Intravenous Given 10/26/21 1326)  morphine (PF) 4 MG/ML injection 4 mg (4 mg Intravenous Given 10/26/21 1326)  iohexol (OMNIPAQUE) 350 MG/ML injection 100 mL (75 mLs Intravenous Contrast Given 10/26/21 1359)    ED Course/ Medical Decision Making/ A&P                           Medical Decision Making Amount and/or Complexity of Data Reviewed Radiology: ordered.  Risk Prescription drug management.   This patient presents to the ED for concern of upper  abdominal pain with nausea, vomiting, diarrhea, this involves an extensive number of treatment options, and is a complaint that carries with it a high risk of complications and morbidity.  The differential diagnosis includes but not limited to gastroenteritis, gastritis, pancreatitis, acute cholecystitis   Co morbidities that complicate the patient evaluation  Hypothyroid, hyperlipidemia   Additional history obtained:  External records from outside source obtained and reviewed including prior blood pressures on file, consistently elevated including 184/95,   Lab Tests:  I Ordered, and personally interpreted labs.  The pertinent results include: Lipase within normal meds.  CMP with mild hypokalemia with potassium 3.4.  CBC with mildly elevated WBC at 11.3.  Urinalysis with ketones and protein, trace leukocytes without urinary symptoms.   Imaging Studies ordered:  I ordered imaging studies including CT and pelvis I independently visualized and interpreted imaging which showed negative I agree with the radiologist interpretation, question peptic ulcer disease   Problem List / ED Course / Critical interventions / Medication management  66 year old female presents with epigastric pain and vomiting.  She is found to have upper abdominal and right upper quadrant tenderness with pain reproduced palpation in her left side back.  Labs are largely reassuring.  Her CT scan suggests peptic ulcer disease, normal gallbladder.  Patient is given IV pain medication and antiemetics, and IV fluids.  Pain is improved.  She remains hypertensive.  Her blood pressures have been elevated on prior recordings on file, will start Norvasc.  Recommend she monitor her blood pressure and follow-up with her doctor for recheck.  Regarding peptic ulcer disease, started on Protonix, discussed limiting NSAID use and following up with GI, she is also due for a colonoscopy.  She is provided with a Lidoderm patch for her back,  prescription for Voltaren. I ordered medication including morphine, Zofran, IV fluids, Lidoderm patch, Norvasc for pain, nausea, hypertension Reevaluation of the patient after these medicines showed that the patient improved I have reviewed the patients home medicines and have made adjustments as needed   Social Determinants of Health:  Has PCP   Test / Admission - Considered:  Discussed follow-up with GI for possible endoscopy.  Notes that she had a colonoscopy greater than 37 years ago.  Patient agrees to follow-up, referral provided.         Final Clinical Impression(s) / ED Diagnoses Final diagnoses:  Peptic ulcer disease  Epigastric pain  Blood pressure elevated without history of HTN    Rx / DC Orders ED Discharge Orders          Ordered    diclofenac Sodium (VOLTAREN) 1 % GEL  4 times daily        10/26/21 1448    amLODipine (NORVASC) 5 MG tablet  Daily        10/26/21 1448    pantoprazole (PROTONIX) 40 MG tablet  2 times daily        10/26/21 1448              Tacy Learn, PA-C 10/26/21 1457    Pattricia Boss, MD 10/29/21 1150

## 2021-10-26 NOTE — ED Notes (Signed)
ED Provider at bedside. 

## 2021-10-26 NOTE — ED Notes (Signed)
Patient placed in recliner for comfort 

## 2021-10-26 NOTE — ED Notes (Signed)
Pt was able to ambulate to restroom and back w/o assistance.

## 2021-10-26 NOTE — ED Notes (Signed)
Pt tolerated cranberry juice well.

## 2021-11-04 DIAGNOSIS — I10 Essential (primary) hypertension: Secondary | ICD-10-CM | POA: Diagnosis not present

## 2021-11-04 DIAGNOSIS — R1012 Left upper quadrant pain: Secondary | ICD-10-CM | POA: Diagnosis not present

## 2021-11-04 DIAGNOSIS — Z1211 Encounter for screening for malignant neoplasm of colon: Secondary | ICD-10-CM | POA: Diagnosis not present

## 2021-11-04 DIAGNOSIS — E782 Mixed hyperlipidemia: Secondary | ICD-10-CM | POA: Diagnosis not present

## 2021-11-04 DIAGNOSIS — E669 Obesity, unspecified: Secondary | ICD-10-CM | POA: Diagnosis not present

## 2021-11-04 DIAGNOSIS — R933 Abnormal findings on diagnostic imaging of other parts of digestive tract: Secondary | ICD-10-CM | POA: Diagnosis not present

## 2021-11-08 DIAGNOSIS — K2970 Gastritis, unspecified, without bleeding: Secondary | ICD-10-CM | POA: Diagnosis not present

## 2021-11-08 DIAGNOSIS — K269 Duodenal ulcer, unspecified as acute or chronic, without hemorrhage or perforation: Secondary | ICD-10-CM | POA: Diagnosis not present

## 2021-11-08 DIAGNOSIS — K31A11 Gastric intestinal metaplasia without dysplasia, involving the antrum: Secondary | ICD-10-CM | POA: Diagnosis not present

## 2021-11-08 DIAGNOSIS — K319 Disease of stomach and duodenum, unspecified: Secondary | ICD-10-CM | POA: Diagnosis not present

## 2021-11-08 DIAGNOSIS — R933 Abnormal findings on diagnostic imaging of other parts of digestive tract: Secondary | ICD-10-CM | POA: Diagnosis not present

## 2021-11-08 DIAGNOSIS — K259 Gastric ulcer, unspecified as acute or chronic, without hemorrhage or perforation: Secondary | ICD-10-CM | POA: Diagnosis not present

## 2021-11-08 DIAGNOSIS — R1012 Left upper quadrant pain: Secondary | ICD-10-CM | POA: Diagnosis not present

## 2021-11-12 DIAGNOSIS — M5136 Other intervertebral disc degeneration, lumbar region: Secondary | ICD-10-CM | POA: Diagnosis not present

## 2021-11-12 DIAGNOSIS — M791 Myalgia, unspecified site: Secondary | ICD-10-CM | POA: Diagnosis not present

## 2021-11-12 DIAGNOSIS — M5417 Radiculopathy, lumbosacral region: Secondary | ICD-10-CM | POA: Diagnosis not present

## 2021-11-12 DIAGNOSIS — F1120 Opioid dependence, uncomplicated: Secondary | ICD-10-CM | POA: Diagnosis not present

## 2021-11-15 DIAGNOSIS — Z1211 Encounter for screening for malignant neoplasm of colon: Secondary | ICD-10-CM | POA: Diagnosis not present

## 2021-11-15 DIAGNOSIS — Z1212 Encounter for screening for malignant neoplasm of rectum: Secondary | ICD-10-CM | POA: Diagnosis not present

## 2021-11-29 LAB — COLOGUARD: COLOGUARD: NEGATIVE

## 2022-01-06 DIAGNOSIS — K219 Gastro-esophageal reflux disease without esophagitis: Secondary | ICD-10-CM | POA: Diagnosis not present

## 2022-01-06 DIAGNOSIS — E669 Obesity, unspecified: Secondary | ICD-10-CM | POA: Diagnosis not present

## 2022-01-06 DIAGNOSIS — K269 Duodenal ulcer, unspecified as acute or chronic, without hemorrhage or perforation: Secondary | ICD-10-CM | POA: Diagnosis not present

## 2022-01-06 DIAGNOSIS — K2970 Gastritis, unspecified, without bleeding: Secondary | ICD-10-CM | POA: Diagnosis not present

## 2022-01-13 IMAGING — CT CT RENAL STONE PROTOCOL
2 of 4 series · 16 of 46 positions shown, 18 images · non-contrast
Comparison: 05/14/2011

CLINICAL DATA: Flank pain, kidney stone suspected. Right side flank
pain, nausea

EXAM:
CT ABDOMEN AND PELVIS WITHOUT CONTRAST
TECHNIQUE: Multidetector CT imaging of the abdomen and pelvis was performed
following the standard protocol without IV contrast.

[Series 3: renal stone 5.0 · axial · 0.98mm/px · z∈[-958,-528]mm · 13 of 94 slices shown, 15 images]
[im 4/94  soft-tissue]
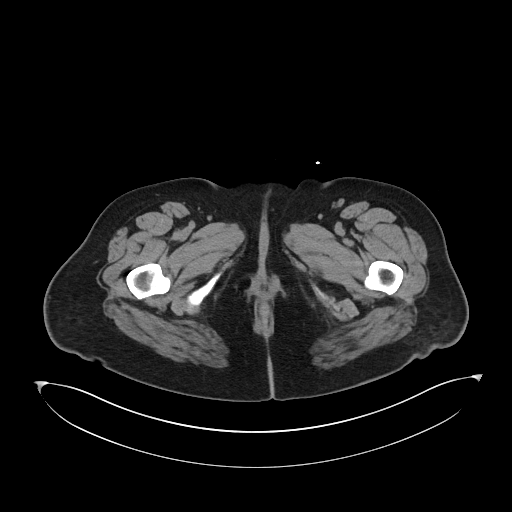
[im 4/94  bone]
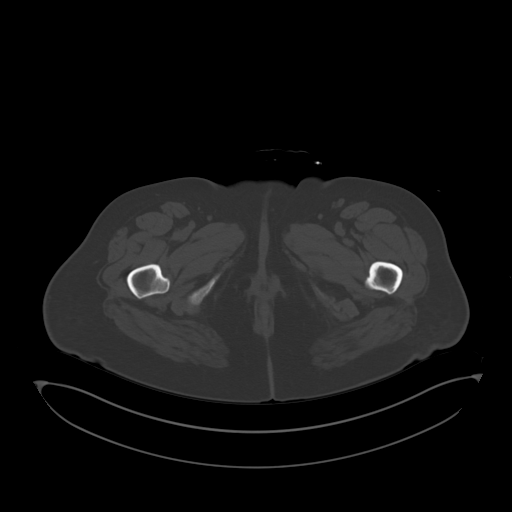
[im 12/94  soft-tissue]
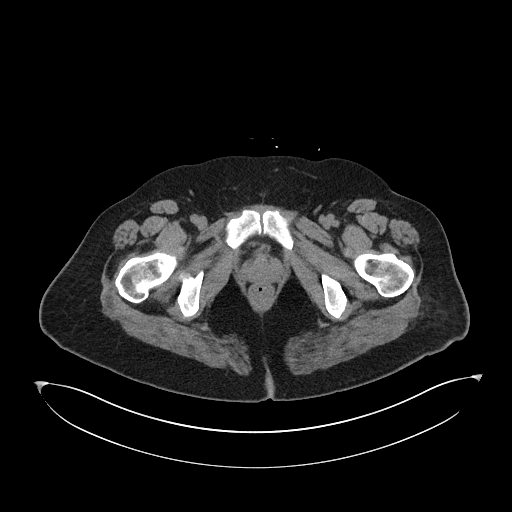
[im 20/94  soft-tissue]
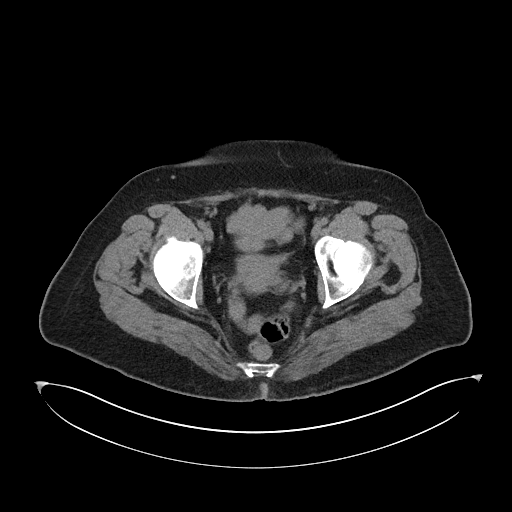
[im 28/94  soft-tissue]
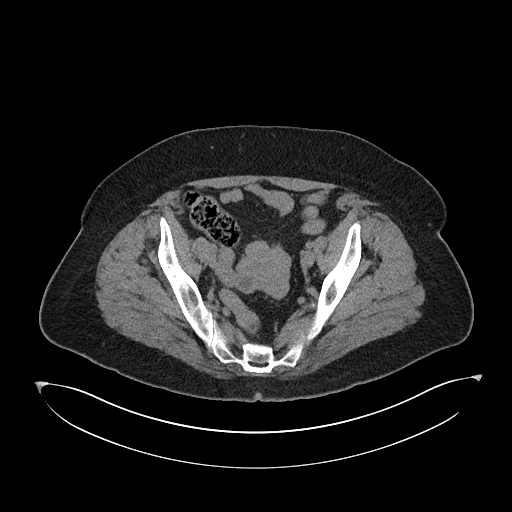
[im 32/94  soft-tissue]
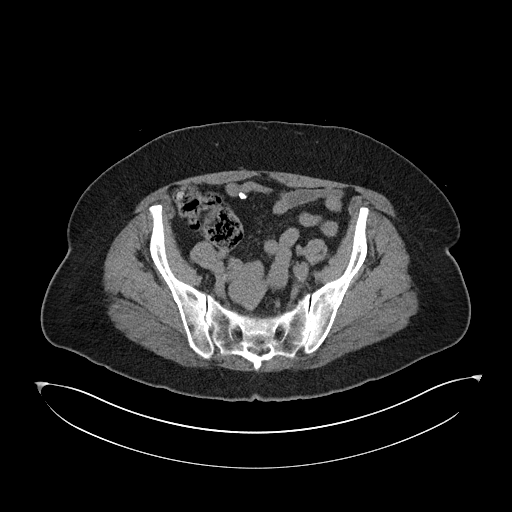
[im 39/94  soft-tissue]
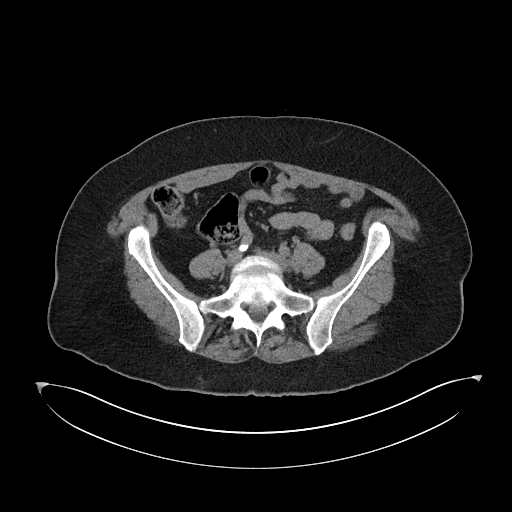
[im 47/94  soft-tissue]
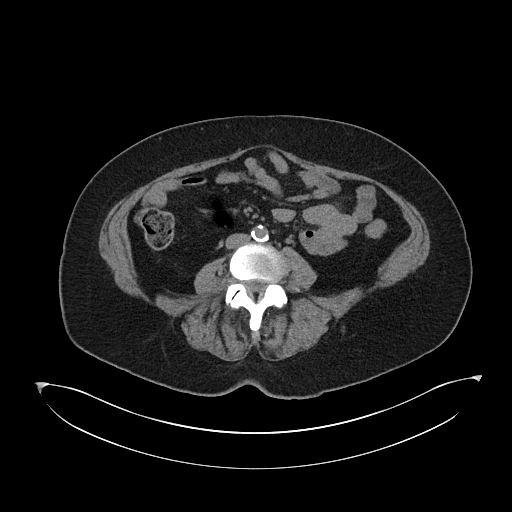
[im 55/94  soft-tissue]
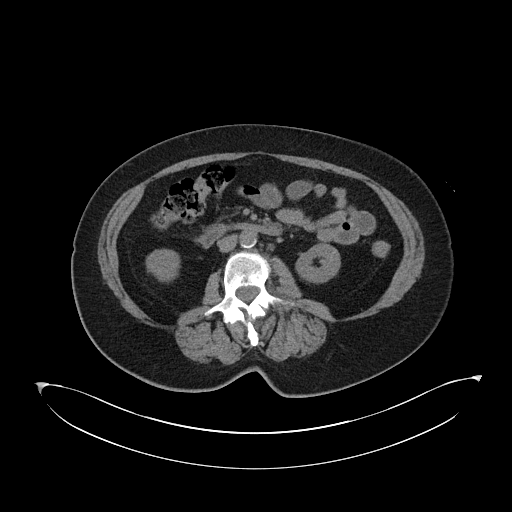
[im 63/94  soft-tissue]
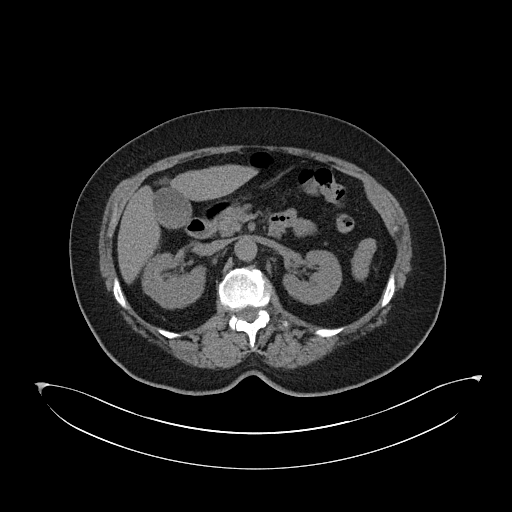
[im 63/94  bone]
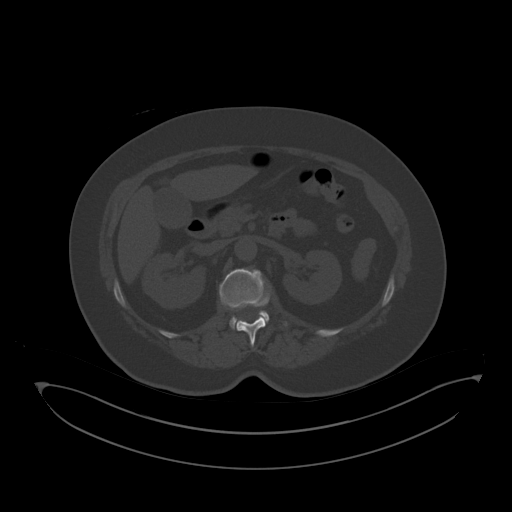
[im 66/94  soft-tissue]
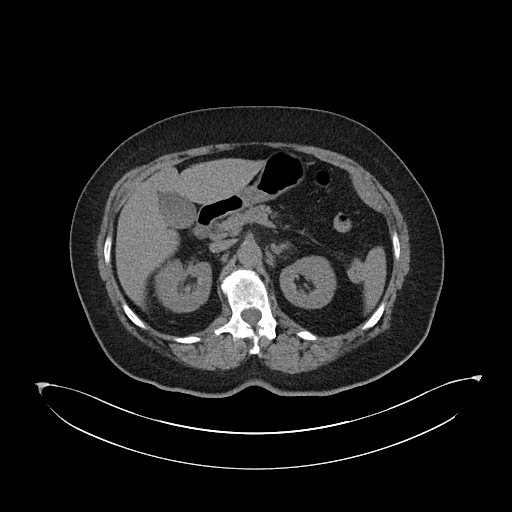
[im 74/94  soft-tissue]
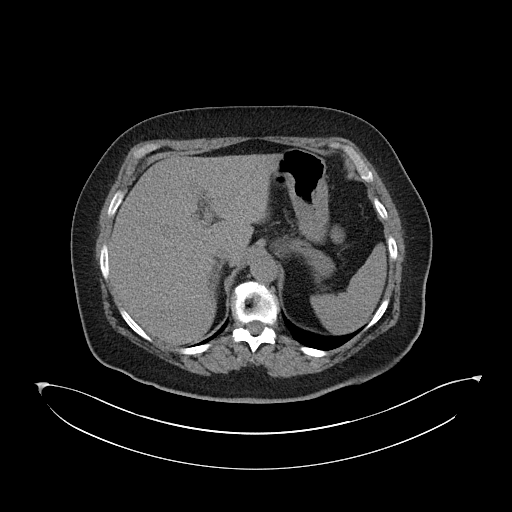
[im 82/94  soft-tissue]
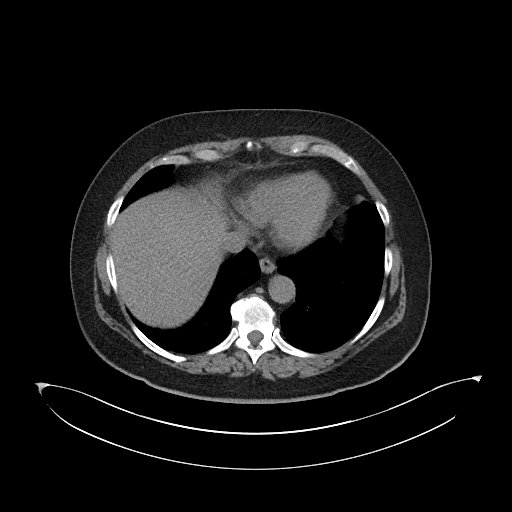
[im 90/94  soft-tissue]
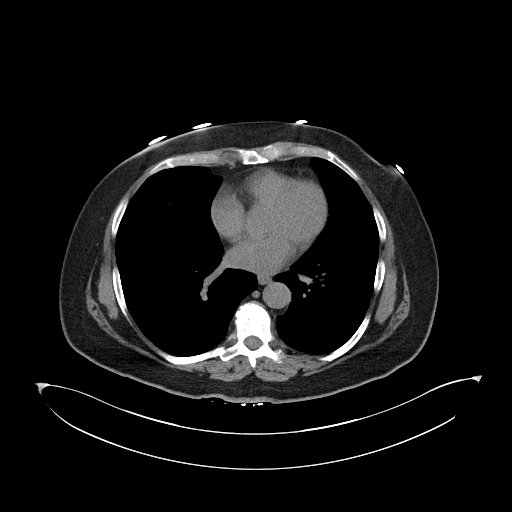

[Series 6: cor · coronal · 0.92mm/px · 3 of 143 slices shown]
[im 48/143  soft-tissue]
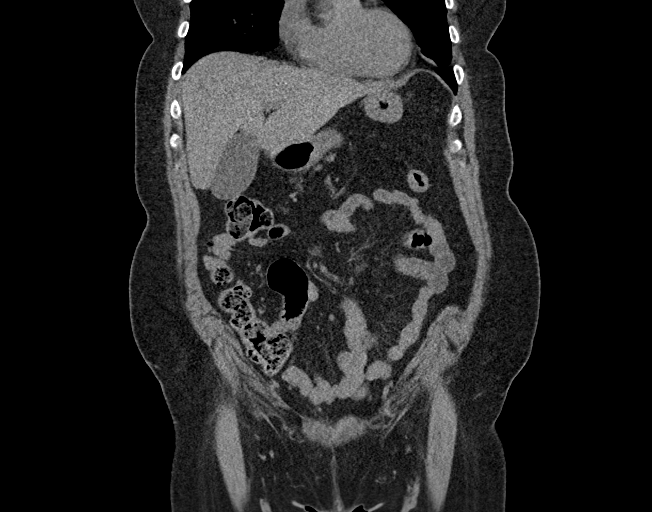
[im 64/143  soft-tissue]
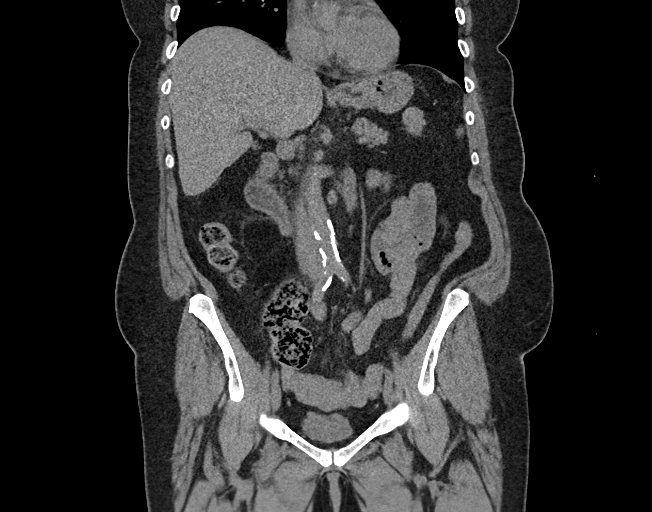
[im 79/143  soft-tissue]
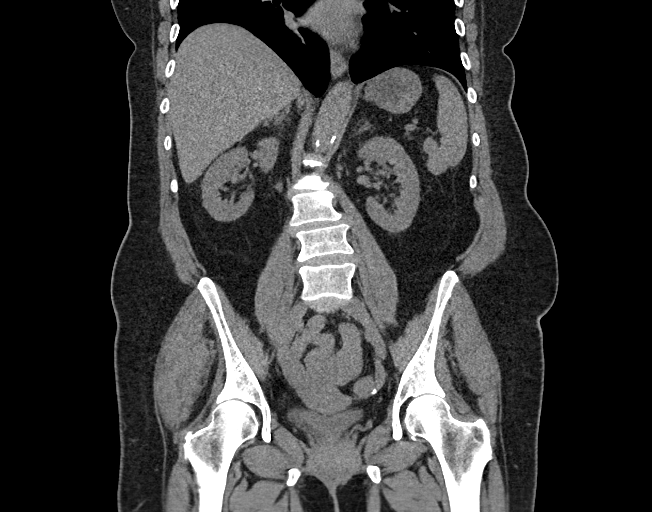

[16 of 46 positions shown; findings below may reference images not displayed]

FINDINGS: Lower chest: Lung bases are clear. No effusions. Heart is normal
size.

Hepatobiliary: No focal hepatic abnormality. Gallbladder
unremarkable.

Pancreas: No focal abnormality or ductal dilatation.

Spleen: No focal abnormality.  Normal size.

Adrenals/Urinary Tract: Bilateral adrenal fullness, likely
hyperplasia. No renal or ureteral stones. No hydronephrosis. Urinary
bladder decompressed, unremarkable.

Stomach/Bowel: Stomach, large and small bowel grossly unremarkable.

Vascular/Lymphatic: Aortoiliac atherosclerosis. No evidence of
aneurysm or adenopathy.

Reproductive: Uterus and adnexa unremarkable.  No mass.

Other: No free fluid or free air.

Musculoskeletal: No acute bony abnormality.
IMPRESSION: No renal or ureteral stones.  No hydronephrosis.

Aortic atherosclerosis.

No acute findings in the abdomen or pelvis.

## 2022-01-13 IMAGING — US US ABDOMEN LIMITED
1 series · 14 of 25 positions shown · non-contrast
Comparison: CT from earlier the same day.

CLINICAL DATA: Initial evaluation for acute abdominal pain.

EXAM:
ULTRASOUND ABDOMEN LIMITED RIGHT UPPER QUADRANT

[Series 1: us abdomen limited ruq (liver/gb) · 14 of 55 slices shown]
[im 1/55]
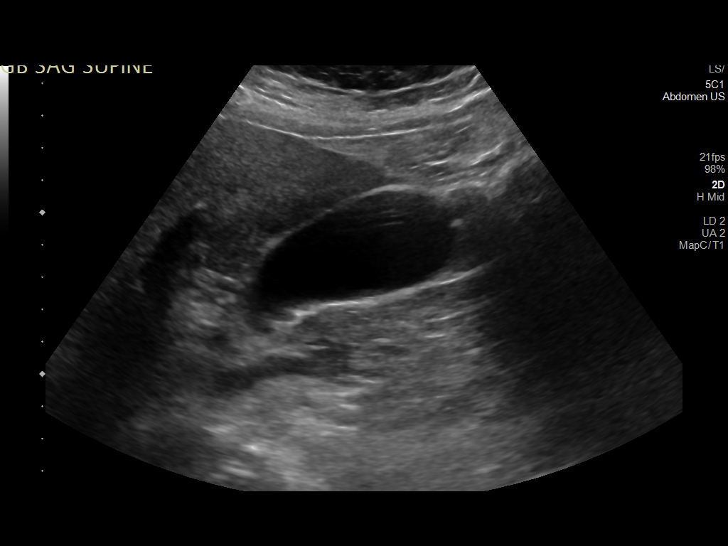
[im 5/55]
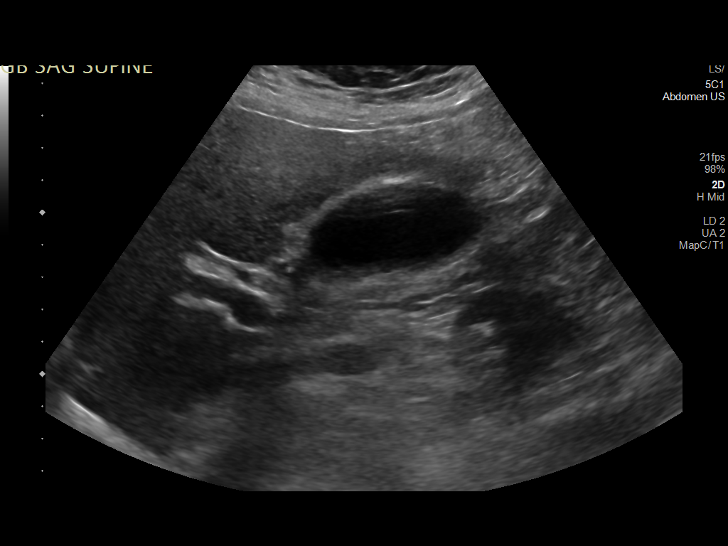
[im 10/55]
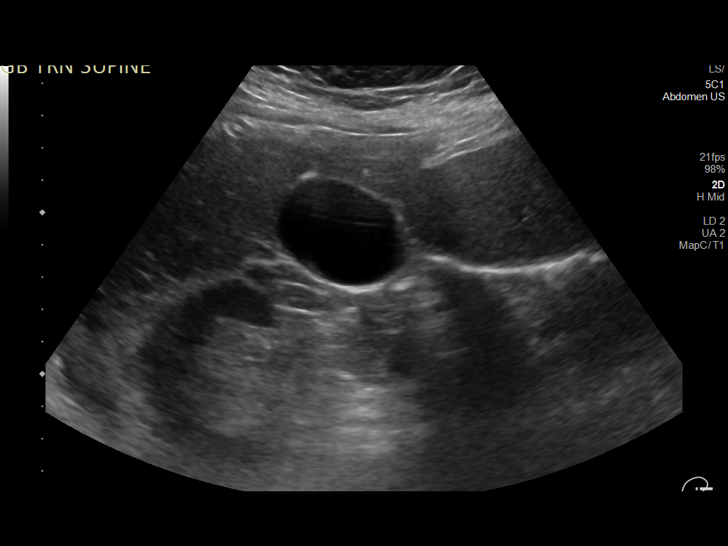
[im 14/55]
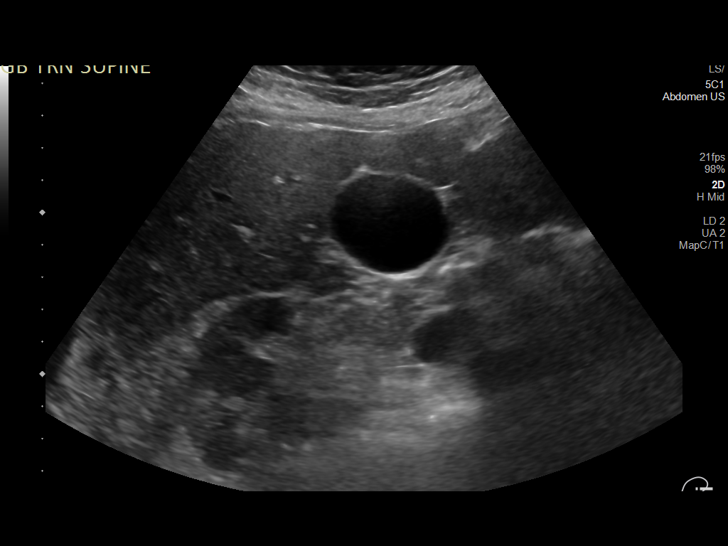
[im 19/55]
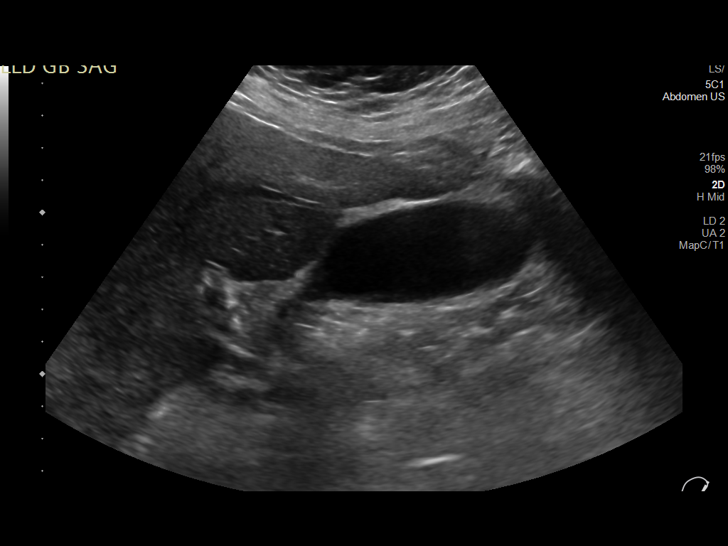
[im 21/55]
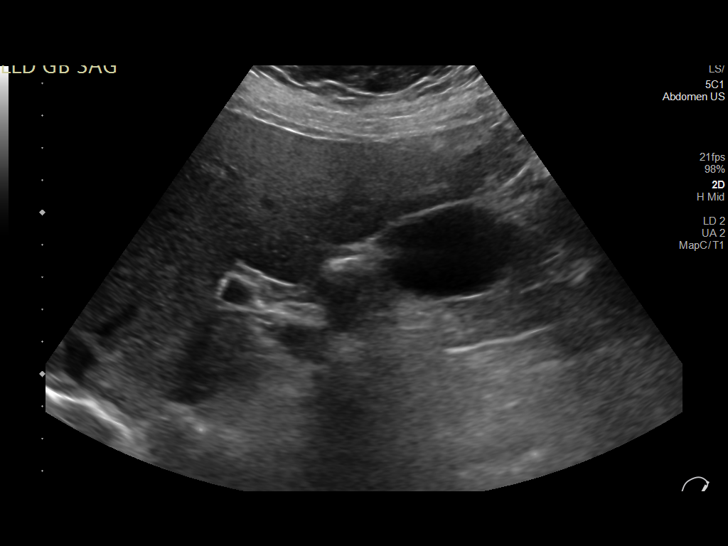
[im 25/55]
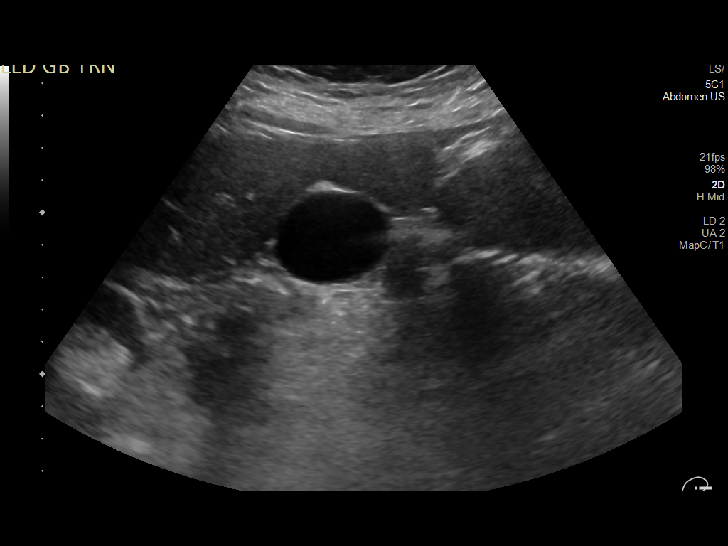
[im 30/55]
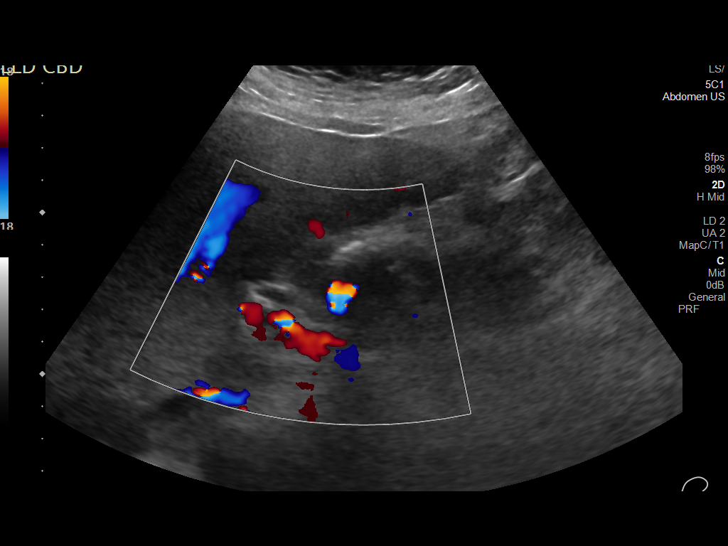
[im 34/55]
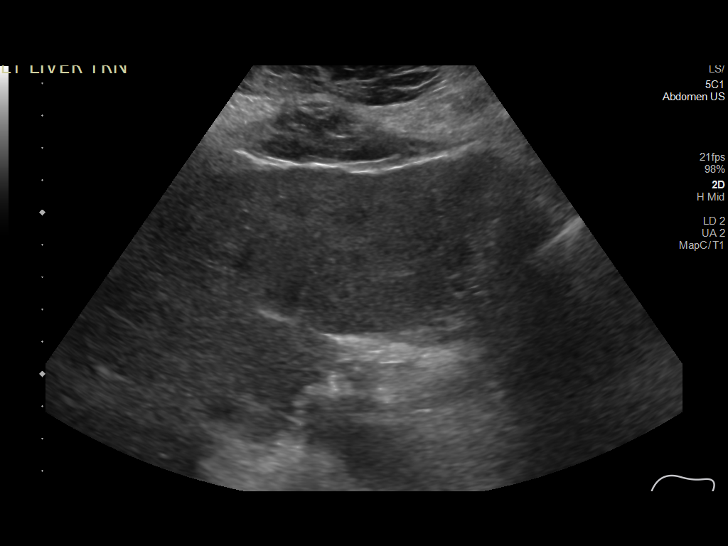
[im 37/55]
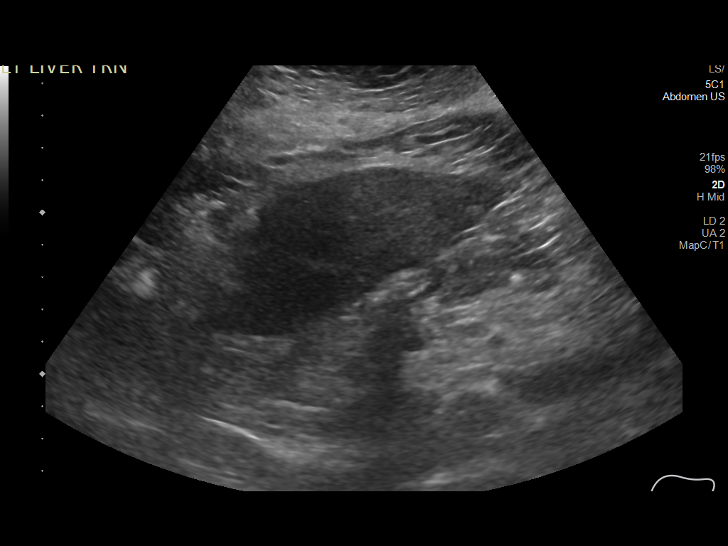
[im 41/55]
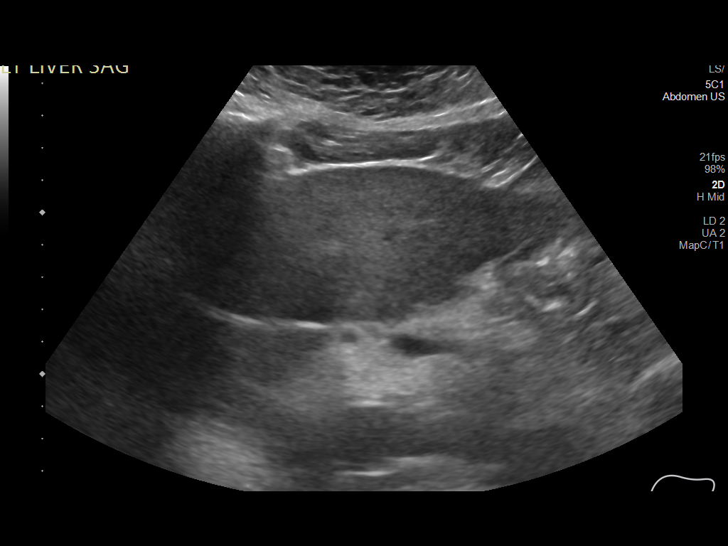
[im 46/55]
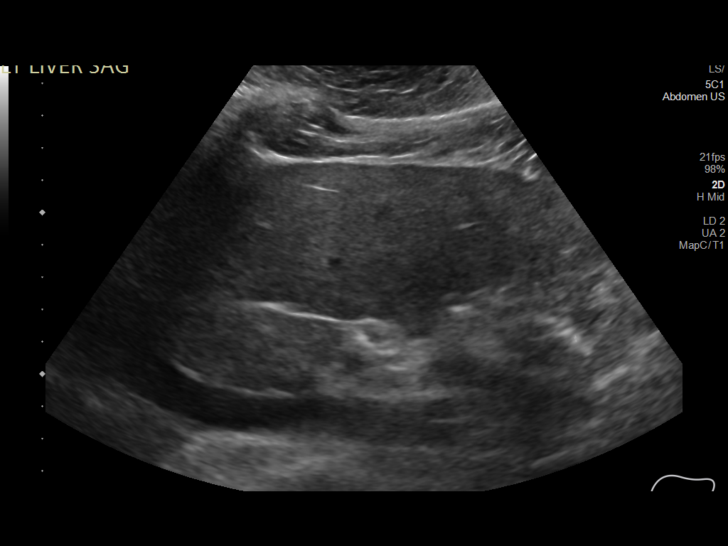
[im 50/55]
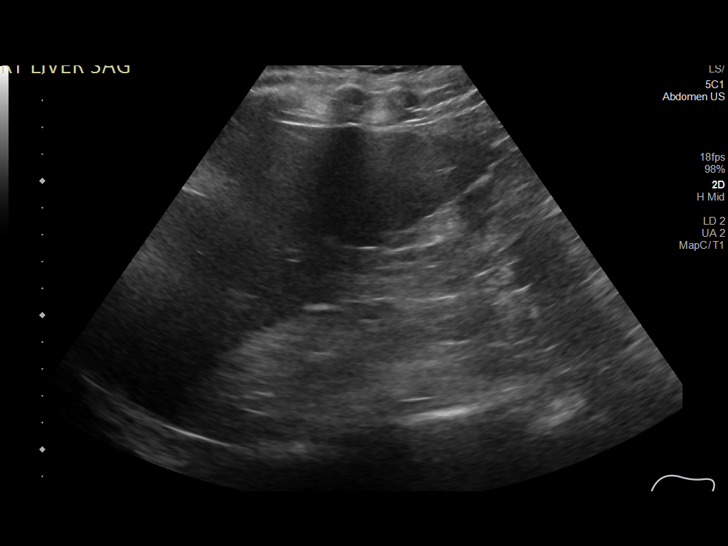
[im 55/55]
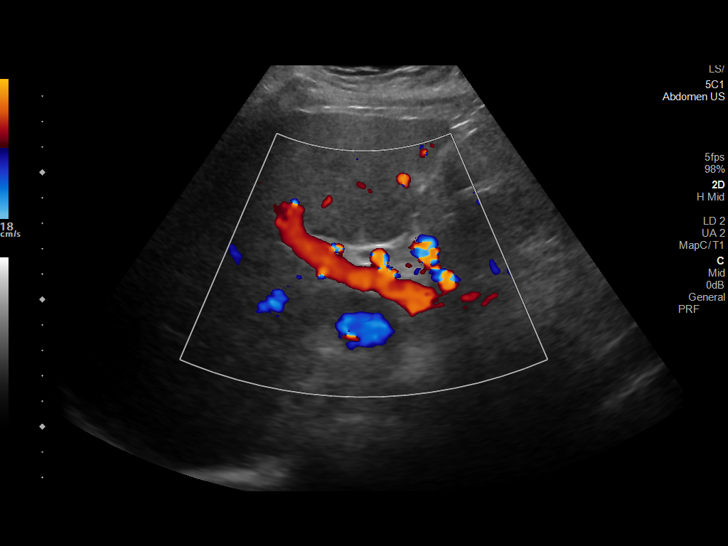

[14 of 25 positions shown; findings below may reference images not displayed]

FINDINGS: Gallbladder:

No gallstones or wall thickening visualized. No sonographic Murphy
sign noted by sonographer.

Common bile duct:

Diameter: 5 mm

Liver:

No focal lesion identified. Within normal limits in parenchymal
echogenicity. Portal vein is patent on color Doppler imaging with
normal direction of blood flow towards the liver.

Other: None.
IMPRESSION: Normal right upper quadrant ultrasound.

## 2022-02-01 DIAGNOSIS — Z1152 Encounter for screening for COVID-19: Secondary | ICD-10-CM | POA: Diagnosis not present

## 2022-02-01 DIAGNOSIS — M5416 Radiculopathy, lumbar region: Secondary | ICD-10-CM | POA: Diagnosis not present

## 2022-02-01 DIAGNOSIS — R030 Elevated blood-pressure reading, without diagnosis of hypertension: Secondary | ICD-10-CM | POA: Diagnosis not present

## 2022-02-01 DIAGNOSIS — R5383 Other fatigue: Secondary | ICD-10-CM | POA: Diagnosis not present

## 2022-02-01 DIAGNOSIS — M5417 Radiculopathy, lumbosacral region: Secondary | ICD-10-CM | POA: Diagnosis not present

## 2022-02-01 DIAGNOSIS — M5136 Other intervertebral disc degeneration, lumbar region: Secondary | ICD-10-CM | POA: Diagnosis not present

## 2022-02-01 DIAGNOSIS — Z6828 Body mass index (BMI) 28.0-28.9, adult: Secondary | ICD-10-CM | POA: Diagnosis not present

## 2022-02-01 DIAGNOSIS — J029 Acute pharyngitis, unspecified: Secondary | ICD-10-CM | POA: Diagnosis not present

## 2022-02-01 DIAGNOSIS — R599 Enlarged lymph nodes, unspecified: Secondary | ICD-10-CM | POA: Diagnosis not present

## 2022-02-01 DIAGNOSIS — F1120 Opioid dependence, uncomplicated: Secondary | ICD-10-CM | POA: Diagnosis not present

## 2022-02-09 DIAGNOSIS — R030 Elevated blood-pressure reading, without diagnosis of hypertension: Secondary | ICD-10-CM | POA: Diagnosis not present

## 2022-02-09 DIAGNOSIS — J029 Acute pharyngitis, unspecified: Secondary | ICD-10-CM | POA: Diagnosis not present

## 2022-02-09 DIAGNOSIS — L089 Local infection of the skin and subcutaneous tissue, unspecified: Secondary | ICD-10-CM | POA: Diagnosis not present

## 2022-03-11 ENCOUNTER — Ambulatory Visit: Payer: Self-pay | Admitting: Surgery

## 2022-03-11 DIAGNOSIS — R229 Localized swelling, mass and lump, unspecified: Secondary | ICD-10-CM | POA: Diagnosis not present

## 2022-03-11 NOTE — H&P (Signed)
Crystal Kirby [id number]   Referring Provider:  Bonney Aid, MD   Subjective   Chief Complaint: New Consultation (Lump on the back of the neck)     History of Present Illness:    67 year old woman with history of Graves' disease/hypothyroid, narcolepsy, hypercholesterolemia who presents with a mass on the back of her neck.  She first noticed this as a blackhead a little over a year ago.  She was able to drain this and it seemed to be quiescent for quite some time however a few months ago the lesion recurred and has been slowly increasing in size since then.  It has not drained.  It is uncomfortable especially when she turns her neck and she is always aware that it is there.  No recent infections or procedures in this area.   Review of Systems: A complete review of systems was obtained from the patient.  I have reviewed this information and discussed as appropriate with the patient.  See HPI as well for other ROS.   Medical History: Past Medical History:  Diagnosis Date   Anxiety    Arthritis    Hyperlipidemia    Hypertension    Thyroid disease     There is no problem list on file for this patient.   Past Surgical History:  Procedure Laterality Date   CESAREAN SECTION  10/1984   LAPAROSCOPIC TUBAL LIGATION  1989     Allergies  Allergen Reactions   Promethazine Hcl Rash    REACTION: rash    Current Outpatient Medications on File Prior to Visit  Medication Sig Dispense Refill   amLODIPine (NORVASC) 5 MG tablet      HYDROcodone-acetaminophen (NORCO) 7.5-325 mg tablet TAKE 1 TABLET BY ORAL ROUTE EVERY 6 HOURS AS NEEDED FOR PAIN (DNF 02/12/22)     levothyroxine (SYNTHROID) 175 MCG tablet      methocarbamoL (ROBAXIN) 500 MG tablet      multivitamin tablet Take 1 tablet by mouth once daily     pantoprazole (PROTONIX) 40 MG DR tablet Take 40 mg by mouth once daily     PARoxetine (PAXIL) 40 MG tablet      No current facility-administered medications on file  prior to visit.    History reviewed. No pertinent family history.   Social History   Tobacco Use  Smoking Status Every Day   Packs/day: .5   Types: Cigarettes  Smokeless Tobacco Never     Social History   Socioeconomic History   Marital status: Married  Tobacco Use   Smoking status: Every Day    Packs/day: .5    Types: Cigarettes   Smokeless tobacco: Never  Substance and Sexual Activity   Alcohol use: Yes   Drug use: Never    Objective:    Vitals:   03/11/22 1038  BP: (!) 145/78  Pulse: 83  Temp: 36.3 C (97.3 F)  SpO2: 98%  Weight: 75.3 kg (166 lb)  Height: 162.6 cm ('5\' 4"'$ )    Body mass index is 28.49 kg/m.  Gen: A&Ox3, no distress  Unlabored respirations On the back of her neck just to the right of midline is an approximately 5 cm mobile subcutaneous mass versus cyst with a slightly elevated pore along the right inferior aspect.  Assessment and Plan:  Diagnoses and all orders for this visit:  Subcutaneous mass    By history this is most likely a sebaceous cyst however on exam it does have a more solid component similar to a  lipoma.  This is making her uncomfortable and she desires excision.  I discussed the procedure with her including risks of bleeding, infection, pain, scarring, wound healing problems, seroma/hematoma, recurrence.  Discussed that this is an outpatient procedure and minimal postoperative restrictions during recovery.  Questions were welcomed and answered to her satisfaction.  She would like to meet with the schedulers today to discuss timing and finances.  Veretta Sabourin Raquel James, MD

## 2022-03-15 ENCOUNTER — Encounter (HOSPITAL_BASED_OUTPATIENT_CLINIC_OR_DEPARTMENT_OTHER): Payer: Self-pay | Admitting: Surgery

## 2022-03-15 NOTE — Progress Notes (Signed)
Spoke w/ via phone for pre-op interview---[facility] needs dos---- EKG per anesthesia             Lab results------ COVID test -----patient states asymptomatic no test needed Arrive at -------1230 NPO after MN NO Solid Food.  Clear liquids from MN until---1130 Med rec completed Medications to take morning of surgery -----Paxil and Levothyroxine Diabetic medication ----- Patient instructed no nail polish to be worn day of surgery Patient instructed to bring photo id and insurance card day of surgery Patient aware to have Driver (ride ) / caregiver Husband Jyl Reifer    for 24 hours after surgery  Patient Special Instructions ----- Pre-Op special Istructions ----- Patient verbalized understanding of instructions that were given at this phone interview. Patient denies shortness of breath, chest pain, fever, cough at this phone interview.

## 2022-03-21 DIAGNOSIS — I10 Essential (primary) hypertension: Secondary | ICD-10-CM

## 2022-04-05 ENCOUNTER — Encounter (HOSPITAL_BASED_OUTPATIENT_CLINIC_OR_DEPARTMENT_OTHER): Payer: Self-pay | Admitting: Surgery

## 2022-04-05 NOTE — Progress Notes (Signed)
Spoke w/ via phone for pre-op interview--- [place]----  EKG per anesthesia             Lab results------ COVID test -----patient states asymptomatic no test needed Arrive at -------0530 NPO after MN NO Solid Food.  Med rec completed Medications to take morning of surgery ----- Levothyroxine and Paxil Diabetic medication ----- Patient instructed no nail polish to be worn day of surgery Patient instructed to bring photo id and insurance card day of surgery Patient aware to have Driver (ride ) / caregiver  Sister Foy [place]  for 24 hours after surgery  Patient Special Instructions ----- Pre-Op special Istructions ----- Patient verbalized understanding of instructions that were given at this phone interview. Patient denies shortness of breath, chest pain, fever, cough at this phone interview.

## 2022-04-15 ENCOUNTER — Encounter (HOSPITAL_BASED_OUTPATIENT_CLINIC_OR_DEPARTMENT_OTHER)
Admission: RE | Disposition: A | Discharge status: Home / Self Care | Payer: Self-pay | Source: Home / Self Care | Attending: Surgery

## 2022-04-15 ENCOUNTER — Ambulatory Visit (HOSPITAL_BASED_OUTPATIENT_CLINIC_OR_DEPARTMENT_OTHER): Payer: Medicare HMO | Admitting: Anesthesiology

## 2022-04-15 ENCOUNTER — Ambulatory Visit (HOSPITAL_BASED_OUTPATIENT_CLINIC_OR_DEPARTMENT_OTHER)
Admission: RE | Admit: 2022-04-15 | Discharge: 2022-04-15 | Disposition: A | Discharge status: Home / Self Care | Payer: Medicare HMO | Attending: Surgery | Admitting: Surgery

## 2022-04-15 ENCOUNTER — Other Ambulatory Visit: Payer: Self-pay

## 2022-04-15 ENCOUNTER — Encounter (HOSPITAL_BASED_OUTPATIENT_CLINIC_OR_DEPARTMENT_OTHER): Payer: Self-pay | Admitting: Surgery

## 2022-04-15 DIAGNOSIS — K219 Gastro-esophageal reflux disease without esophagitis: Secondary | ICD-10-CM | POA: Diagnosis not present

## 2022-04-15 DIAGNOSIS — Z79890 Hormone replacement therapy (postmenopausal): Secondary | ICD-10-CM | POA: Diagnosis not present

## 2022-04-15 DIAGNOSIS — F17210 Nicotine dependence, cigarettes, uncomplicated: Secondary | ICD-10-CM | POA: Diagnosis not present

## 2022-04-15 DIAGNOSIS — I10 Essential (primary) hypertension: Secondary | ICD-10-CM

## 2022-04-15 DIAGNOSIS — D170 Benign lipomatous neoplasm of skin and subcutaneous tissue of head, face and neck: Secondary | ICD-10-CM

## 2022-04-15 DIAGNOSIS — F17200 Nicotine dependence, unspecified, uncomplicated: Secondary | ICD-10-CM | POA: Diagnosis not present

## 2022-04-15 DIAGNOSIS — G47419 Narcolepsy without cataplexy: Secondary | ICD-10-CM | POA: Insufficient documentation

## 2022-04-15 DIAGNOSIS — Z79899 Other long term (current) drug therapy: Secondary | ICD-10-CM | POA: Insufficient documentation

## 2022-04-15 DIAGNOSIS — L723 Sebaceous cyst: Secondary | ICD-10-CM | POA: Diagnosis not present

## 2022-04-15 DIAGNOSIS — E039 Hypothyroidism, unspecified: Secondary | ICD-10-CM | POA: Diagnosis not present

## 2022-04-15 DIAGNOSIS — F419 Anxiety disorder, unspecified: Secondary | ICD-10-CM | POA: Insufficient documentation

## 2022-04-15 DIAGNOSIS — E7800 Pure hypercholesterolemia, unspecified: Secondary | ICD-10-CM | POA: Insufficient documentation

## 2022-04-15 DIAGNOSIS — R6889 Other general symptoms and signs: Secondary | ICD-10-CM | POA: Diagnosis not present

## 2022-04-15 HISTORY — DX: Depression, unspecified: F32.A

## 2022-04-15 HISTORY — PX: CYST REMOVAL NECK: SHX6281

## 2022-04-15 HISTORY — DX: Essential (primary) hypertension: I10

## 2022-04-15 HISTORY — DX: Gastro-esophageal reflux disease without esophagitis: K21.9

## 2022-04-15 HISTORY — DX: Anxiety disorder, unspecified: F41.9

## 2022-04-15 SURGERY — EXCISION, CYST, NECK
Anesthesia: Monitor Anesthesia Care

## 2022-04-15 MED ORDER — PROPOFOL 500 MG/50ML IV EMUL
INTRAVENOUS | Status: DC | PRN
  Administered 2022-04-15: 50 ug/kg/min via INTRAVENOUS

## 2022-04-15 MED ORDER — FENTANYL CITRATE (PF) 100 MCG/2ML IJ SOLN
INTRAMUSCULAR | Status: AC
  Filled 2022-04-15: qty 2

## 2022-04-15 MED ORDER — FENTANYL CITRATE (PF) 100 MCG/2ML IJ SOLN: 25.0000 ug | INTRAMUSCULAR | Status: DC | PRN

## 2022-04-15 MED ORDER — DEXAMETHASONE SODIUM PHOSPHATE 4 MG/ML IJ SOLN
INTRAMUSCULAR | Status: DC | PRN
  Administered 2022-04-15: 5 mg via INTRAVENOUS

## 2022-04-15 MED ORDER — DEXMEDETOMIDINE HCL IN NACL 80 MCG/20ML IV SOLN
INTRAVENOUS | Status: AC
  Filled 2022-04-15: qty 20

## 2022-04-15 MED ORDER — CHLORHEXIDINE GLUCONATE 4 % EX LIQD: 60.0000 mL | Freq: Once | CUTANEOUS | Status: DC

## 2022-04-15 MED ORDER — OXYCODONE HCL 5 MG PO TABS: 5.0000 mg | ORAL_TABLET | Freq: Once | ORAL | Status: DC | PRN

## 2022-04-15 MED ORDER — MIDAZOLAM HCL 2 MG/2ML IJ SOLN
INTRAMUSCULAR | Status: AC
  Filled 2022-04-15: qty 2

## 2022-04-15 MED ORDER — LIDOCAINE HCL (CARDIAC) PF 100 MG/5ML IV SOSY
PREFILLED_SYRINGE | INTRAVENOUS | Status: DC | PRN
  Administered 2022-04-15: 60 mg via INTRAVENOUS

## 2022-04-15 MED ORDER — ONDANSETRON HCL 4 MG/2ML IJ SOLN
INTRAMUSCULAR | Status: DC | PRN
  Administered 2022-04-15: 4 mg via INTRAVENOUS

## 2022-04-15 MED ORDER — OXYCODONE HCL 5 MG/5ML PO SOLN: 5.0000 mg | Freq: Once | ORAL | Status: DC | PRN

## 2022-04-15 MED ORDER — LACTATED RINGERS IV SOLN: INTRAVENOUS | Status: DC

## 2022-04-15 MED ORDER — ONDANSETRON HCL 4 MG/2ML IJ SOLN: 4.0000 mg | Freq: Once | INTRAMUSCULAR | Status: DC | PRN

## 2022-04-15 MED ORDER — ONDANSETRON HCL 4 MG/2ML IJ SOLN
INTRAMUSCULAR | Status: AC
  Filled 2022-04-15: qty 2

## 2022-04-15 MED ORDER — MIDAZOLAM HCL 5 MG/5ML IJ SOLN
INTRAMUSCULAR | Status: DC | PRN
  Administered 2022-04-15: 2 mg via INTRAVENOUS

## 2022-04-15 MED ORDER — DEXAMETHASONE SODIUM PHOSPHATE 10 MG/ML IJ SOLN
INTRAMUSCULAR | Status: AC
  Filled 2022-04-15: qty 1

## 2022-04-15 MED ORDER — DEXMEDETOMIDINE HCL IN NACL 80 MCG/20ML IV SOLN
INTRAVENOUS | Status: DC | PRN
  Administered 2022-04-15 (×2): 4 ug via BUCCAL

## 2022-04-15 MED ORDER — PROPOFOL 10 MG/ML IV BOLUS
INTRAVENOUS | Status: DC | PRN
  Administered 2022-04-15 (×5): 10 mg via INTRAVENOUS

## 2022-04-15 MED ORDER — CEFAZOLIN SODIUM-DEXTROSE 2-4 GM/100ML-% IV SOLN
INTRAVENOUS | Status: AC
  Filled 2022-04-15: qty 100

## 2022-04-15 MED ORDER — FENTANYL CITRATE (PF) 100 MCG/2ML IJ SOLN
INTRAMUSCULAR | Status: DC | PRN
  Administered 2022-04-15 (×2): 12.5 ug via INTRAVENOUS
  Administered 2022-04-15: 25 ug via INTRAVENOUS
  Administered 2022-04-15: 12.5 ug via INTRAVENOUS
  Administered 2022-04-15: 25 ug via INTRAVENOUS
  Administered 2022-04-15: 12.5 ug via INTRAVENOUS

## 2022-04-15 MED ORDER — PROPOFOL 500 MG/50ML IV EMUL
INTRAVENOUS | Status: AC
  Filled 2022-04-15: qty 50

## 2022-04-15 MED ORDER — ACETAMINOPHEN 500 MG PO TABS
ORAL_TABLET | ORAL | Status: AC
  Filled 2022-04-15: qty 2

## 2022-04-15 MED ORDER — CEFAZOLIN SODIUM-DEXTROSE 2-4 GM/100ML-% IV SOLN
2.0000 g | INTRAVENOUS | Status: AC
  Administered 2022-04-15: 2 g via INTRAVENOUS

## 2022-04-15 MED ORDER — 0.9 % SODIUM CHLORIDE (POUR BTL) OPTIME
TOPICAL | Status: DC | PRN
  Administered 2022-04-15: 500 mL

## 2022-04-15 MED ORDER — KETOROLAC TROMETHAMINE 30 MG/ML IJ SOLN: 30.0000 mg | Freq: Once | INTRAMUSCULAR | Status: DC | PRN

## 2022-04-15 MED ORDER — PROPOFOL 10 MG/ML IV BOLUS
INTRAVENOUS | Status: AC
  Filled 2022-04-15: qty 20

## 2022-04-15 MED ORDER — BUPIVACAINE-EPINEPHRINE 0.25% -1:200000 IJ SOLN
INTRAMUSCULAR | Status: DC | PRN
  Administered 2022-04-15: 25 mL

## 2022-04-15 MED ORDER — ACETAMINOPHEN 500 MG PO TABS
1000.0000 mg | ORAL_TABLET | ORAL | Status: AC
  Administered 2022-04-15: 1000 mg via ORAL

## 2022-04-15 SURGICAL SUPPLY — 46 items
ADH SKN CLS APL DERMABOND .7 (GAUZE/BANDAGES/DRESSINGS) ×1
APL PRP STRL LF DISP 70% ISPRP (MISCELLANEOUS) ×1
BLADE SURG 15 STRL LF DISP TIS (BLADE) ×1 IMPLANT
BLADE SURG 15 STRL SS (BLADE) ×1
BRIEF MESH DISP LRG (UNDERPADS AND DIAPERS) IMPLANT
CHLORAPREP W/TINT 26 (MISCELLANEOUS) IMPLANT
COVER BACK TABLE 60X90IN (DRAPES) ×1 IMPLANT
COVER MAYO STAND STRL (DRAPES) ×1 IMPLANT
DERMABOND ADVANCED .7 DNX12 (GAUZE/BANDAGES/DRESSINGS) IMPLANT
DRAPE LAPAROTOMY 100X72 PEDS (DRAPES) IMPLANT
DRAPE LAPAROTOMY TRNSV 102X78 (DRAPES) IMPLANT
DRAPE UTILITY XL STRL (DRAPES) ×1 IMPLANT
ELECT REM PT RETURN 9FT ADLT (ELECTROSURGICAL) ×1
ELECTRODE REM PT RTRN 9FT ADLT (ELECTROSURGICAL) ×1 IMPLANT
GAUZE 4X4 16PLY ~~LOC~~+RFID DBL (SPONGE) ×1 IMPLANT
GAUZE PAD ABD 8X10 STRL (GAUZE/BANDAGES/DRESSINGS) IMPLANT
GAUZE SPONGE 4X4 12PLY STRL (GAUZE/BANDAGES/DRESSINGS) ×1 IMPLANT
GLOVE BIO SURGEON STRL SZ 6 (GLOVE) ×1 IMPLANT
GLOVE INDICATOR 6.5 STRL GRN (GLOVE) ×1 IMPLANT
GOWN STRL REUS W/TWL LRG LVL3 (GOWN DISPOSABLE) ×1 IMPLANT
KIT SIGMOIDOSCOPE (SET/KITS/TRAYS/PACK) IMPLANT
KIT TURNOVER CYSTO (KITS) ×1 IMPLANT
NDL HYPO 22X1.5 SAFETY MO (MISCELLANEOUS) IMPLANT
NDL HYPO 25X1 1.5 SAFETY (NEEDLE) IMPLANT
NEEDLE HYPO 22X1.5 SAFETY MO (MISCELLANEOUS) ×1 IMPLANT
NEEDLE HYPO 25X1 1.5 SAFETY (NEEDLE) IMPLANT
PACK BASIN DAY SURGERY FS (CUSTOM PROCEDURE TRAY) ×1 IMPLANT
PENCIL SMOKE EVACUATOR (MISCELLANEOUS) ×1 IMPLANT
SLEEVE SCD COMPRESS KNEE MED (STOCKING) ×1 IMPLANT
SPONGE T-LAP 18X18 ~~LOC~~+RFID (SPONGE) IMPLANT
SPONGE T-LAP 4X18 ~~LOC~~+RFID (SPONGE) IMPLANT
STAPLER VISISTAT 35W (STAPLE) IMPLANT
SUCTION FRAZIER HANDLE 10FR (MISCELLANEOUS)
SUCTION TUBE FRAZIER 10FR DISP (MISCELLANEOUS) IMPLANT
SUT CHROMIC 3 0 SH 27 (SUTURE) IMPLANT
SUT MNCRL AB 4-0 PS2 18 (SUTURE) ×1 IMPLANT
SUT VIC AB 2-0 SH 27 (SUTURE)
SUT VIC AB 2-0 SH 27XBRD (SUTURE) IMPLANT
SUT VIC AB 3-0 SH 27 (SUTURE) ×1
SUT VIC AB 3-0 SH 27X BRD (SUTURE) ×1 IMPLANT
SYR BULB IRRIG 60ML STRL (SYRINGE) ×1 IMPLANT
SYR CONTROL 10ML LL (SYRINGE) ×1 IMPLANT
TOWEL OR 17X24 6PK STRL BLUE (TOWEL DISPOSABLE) ×1 IMPLANT
TRAY DSU PREP LF (CUSTOM PROCEDURE TRAY) IMPLANT
TUBE CONNECTING 12X1/4 (SUCTIONS) ×1 IMPLANT
YANKAUER SUCT BULB TIP NO VENT (SUCTIONS) ×1 IMPLANT

## 2022-04-15 NOTE — H&P (Signed)
 Crystal Kirby [id number]   Referring Provider:  South, Stephen A, MD   Subjective   Chief Complaint: New Consultation (Lump on the back of the neck)     History of Present Illness:    66-year-old woman with history of Graves' disease/hypothyroid, narcolepsy, hypercholesterolemia who presents with Kirby mass on the back of her neck.  She first noticed this as Kirby blackhead Kirby little over Kirby year ago.  She was able to drain this and it seemed to be quiescent for quite some time however Kirby few months ago the lesion recurred and has been slowly increasing in size since then.  It has not drained.  It is uncomfortable especially when she turns her neck and she is always aware that it is there.  No recent infections or procedures in this area.   Review of Systems: Kirby complete review of systems was obtained from the patient.  I have reviewed this information and discussed as appropriate with the patient.  See HPI as well for other ROS.   Medical History: Past Medical History:  Diagnosis Date   Anxiety    Arthritis    Hyperlipidemia    Hypertension    Thyroid disease     There is no problem list on file for this patient.   Past Surgical History:  Procedure Laterality Date   CESAREAN SECTION  10/1984   LAPAROSCOPIC TUBAL LIGATION  1989     Allergies  Allergen Reactions   Promethazine Hcl Rash    REACTION: rash    Current Outpatient Medications on File Prior to Visit  Medication Sig Dispense Refill   amLODIPine (NORVASC) 5 MG tablet      HYDROcodone-acetaminophen (NORCO) 7.5-325 mg tablet TAKE 1 TABLET BY ORAL ROUTE EVERY 6 HOURS AS NEEDED FOR PAIN (DNF 02/12/22)     levothyroxine (SYNTHROID) 175 MCG tablet      methocarbamoL (ROBAXIN) 500 MG tablet      multivitamin tablet Take 1 tablet by mouth once daily     pantoprazole (PROTONIX) 40 MG DR tablet Take 40 mg by mouth once daily     PARoxetine (PAXIL) 40 MG tablet      No current facility-administered medications on file  prior to visit.    History reviewed. No pertinent family history.   Social History   Tobacco Use  Smoking Status Every Day   Packs/day: .5   Types: Cigarettes  Smokeless Tobacco Never     Social History   Socioeconomic History   Marital status: Married  Tobacco Use   Smoking status: Every Day    Packs/day: .5    Types: Cigarettes   Smokeless tobacco: Never  Substance and Sexual Activity   Alcohol use: Yes   Drug use: Never    Objective:    Vitals:   03/11/22 1038  BP: (!) 145/78  Pulse: 83  Temp: 36.3 C (97.3 F)  SpO2: 98%  Weight: 75.3 kg (166 lb)  Height: 162.6 cm (5' 4")    Body mass index is 28.49 kg/m.  Gen: Kirby&Ox3, no distress  Unlabored respirations On the back of her neck just to the right of midline is an approximately 5 cm mobile subcutaneous mass versus cyst with Kirby slightly elevated pore along the right inferior aspect.  Assessment and Plan:  Diagnoses and all orders for this visit:  Subcutaneous mass    By history this is most likely Kirby sebaceous cyst however on exam it does have Kirby more solid component similar to Kirby   lipoma.  This is making her uncomfortable and she desires excision.  I discussed the procedure with her including risks of bleeding, infection, pain, scarring, wound healing problems, seroma/hematoma, recurrence.  Discussed that this is an outpatient procedure and minimal postoperative restrictions during recovery.  Questions were welcomed and answered to her satisfaction.  She would like to meet with the schedulers today to discuss timing and finances.  Crystal Kirby Crystal Elnita Surprenant, MD  

## 2022-04-15 NOTE — Anesthesia Procedure Notes (Signed)
Procedure Name: MAC Date/Time: 04/15/2022 7:30 AM  Performed by: Jessica Priest, CRNAPre-anesthesia Checklist: Patient identified, Emergency Drugs available, Suction available, Patient being monitored and Timeout performed Patient Re-evaluated:Patient Re-evaluated prior to induction Oxygen Delivery Method: Simple face mask Preoxygenation: Pre-oxygenation with 100% oxygen Induction Type: IV induction Placement Confirmation: breath sounds checked- equal and bilateral, CO2 detector and positive ETCO2

## 2022-04-15 NOTE — Anesthesia Preprocedure Evaluation (Signed)
Anesthesia Evaluation  Patient identified by MRN, date of birth, ID band Patient awake    Reviewed: Allergy & Precautions, H&P , NPO status , Patient's Chart, lab work & pertinent test results  Airway Mallampati: II  TM Distance: >3 FB Neck ROM: Full    Dental no notable dental hx.    Pulmonary Current Smoker   Pulmonary exam normal breath sounds clear to auscultation       Cardiovascular hypertension, Normal cardiovascular exam Rhythm:Regular Rate:Normal     Neuro/Psych negative neurological ROS  negative psych ROS   GI/Hepatic Neg liver ROS,GERD  ,,  Endo/Other  Hypothyroidism    Renal/GU negative Renal ROS  negative genitourinary   Musculoskeletal negative musculoskeletal ROS (+)    Abdominal   Peds negative pediatric ROS (+)  Hematology negative hematology ROS (+)   Anesthesia Other Findings   Reproductive/Obstetrics negative OB ROS                             Anesthesia Physical Anesthesia Plan  ASA: 2  Anesthesia Plan: MAC   Post-op Pain Management: Minimal or no pain anticipated   Induction: Intravenous  PONV Risk Score and Plan: 2 and Propofol infusion, Ondansetron and Treatment may vary due to age or medical condition  Airway Management Planned: Simple Face Mask  Additional Equipment:   Intra-op Plan:   Post-operative Plan:   Informed Consent: I have reviewed the patients History and Physical, chart, labs and discussed the procedure including the risks, benefits and alternatives for the proposed anesthesia with the patient or authorized representative who has indicated his/her understanding and acceptance.     Dental advisory given  Plan Discussed with: CRNA and Surgeon  Anesthesia Plan Comments:        Anesthesia Quick Evaluation

## 2022-04-15 NOTE — Discharge Instructions (Addendum)
GENERAL SURGERY: POST OP INSTRUCTIONS  EAT Gradually transition to a high fiber diet with a fiber supplement over the next few weeks after discharge.  Start with a pureed / full liquid diet (see below)  WALK Walk an hour a day (cumulative, not all at once).  Control your pain to do that.    CONTROL PAIN Control pain so that you can walk, sleep, tolerate sneezing/coughing, go up/down stairs.  HAVE A BOWEL MOVEMENT DAILY Keep your bowels regular to avoid problems.  OK to try a laxative to override constipation.  OK to use an antidairrheal to slow down diarrhea.  Call if not better after 2 tries  CALL IF YOU HAVE PROBLEMS/CONCERNS Call if you are still struggling despite following these instructions. Call if you have concerns not answered by these instructions    DIET: Follow a light bland diet & liquids the first 24 hours after arrival home, such as soup, liquids, starches, etc.  Be sure to drink plenty of fluids.  Quickly advance to a usual solid diet within a few days.  Avoid fast food or heavy meals as your are more likely to get nauseated or have irregular bowels.  A low-sugar, high-fiber diet for the rest of your life is ideal.   Take your usually prescribed home medications unless otherwise directed. PAIN CONTROL: Pain is best controlled by a usual combination of three different methods TOGETHER: Ice/Heat Over the counter pain medication Prescription pain medication Most patients will experience some swelling and bruising around the incisions.  Ice packs or heating pads (30-60 minutes up to 6 times a day) will help. Use ice for the first few days to help decrease swelling and bruising, then switch to heat to help relax tight/sore spots and speed recovery.  Some people prefer to use ice alone, heat alone, alternating between ice & heat.  Experiment to what works for you.  Swelling and bruising can take several weeks to resolve.   It is helpful to take an over-the-counter pain  medication regularly for the first few weeks.  Choose one of the following that works best for you: Naproxen (Aleve, etc)  Two 220mg  tabs twice a day OR Ibuprofen (Advil, etc) Three 200mg  tabs four times a day (every meal & bedtime) AND Acetaminophen (Tylenol, etc) 500-650mg  four times a day (every meal & bedtime) You reported that you already have a prescription for pain medication (hydrocodone).  Take your pain medication as prescribed, IF NEEDED.   Avoid getting constipated.  Between the surgery and the pain medications, it is common to experience some constipation.  Increasing fluid intake and taking a fiber supplement (such as Metamucil, Citrucel, FiberCon, MiraLax, etc) 1-2 times a day regularly will usually help prevent this problem from occurring.  A mild laxative (prune juice, Milk of Magnesia, MiraLax, etc) should be taken according to package directions if there are no bowel movements after 48 hours.   Wash / shower every day, starting 2 days after surgery.  You may shower over the skin glue which is waterproof.  Continue to shower over incision(s) after the dressing is off. Glue will flake off after 1-2 weeks.  You may leave the incision open to air. You may replace a dressing/Band-Aid to cover the incision for comfort if you wish.   ACTIVITIES as tolerated:   You may resume regular (light) daily activities beginning the next day--such as daily self-care, walking, climbing stairs--gradually increasing activities as tolerated.  If you can walk 30 minutes without difficulty, it  is safe to try more intense activity such as jogging, treadmill, bicycling, low-impact aerobics, swimming, etc. Save the most intensive and strenuous activity for last such as sit-ups, heavy lifting, contact sports, etc  Refrain from any heavy lifting or straining until you are off narcotics for pain control.   DO NOT PUSH THROUGH PAIN.  Let pain be your guide: If it hurts to do something, don't do it.  Pain is your body  warning you to avoid that activity for another week until the pain goes down. You may drive when you are no longer taking prescription pain medication, you can comfortably wear a seatbelt, and you can safely maneuver your car and apply brakes. You may have sexual intercourse when it is comfortable.  FOLLOW UP in our office Please call [facility] at [phone] to set up an appointment to see your surgeon in the office for a follow-up appointment approximately 2-3 weeks after your surgery. Make sure that you call for this appointment the day you arrive home to insure a convenient appointment time. 9. IF YOU HAVE DISABILITY OR FAMILY LEAVE FORMS, BRING THEM TO THE OFFICE FOR PROCESSING.  DO NOT GIVE THEM TO YOUR DOCTOR.   WHEN TO CALL us [phone]: Poor pain control Reactions / problems with new medications (rash/itching, nausea, etc)  Fever over 101.5 F (38.5 C) Worsening swelling or bruising Continued bleeding from incision. Increased pain, redness, or drainage from the incision Difficulty breathing / swallowing   The clinic staff is available to answer your questions during regular business hours (8:30am-5pm).  Please don't hesitate to call and ask to speak to one of our nurses for clinical concerns.   If you have a medical emergency, go to the nearest emergency room or call 911.  A surgeon from [facility] is always on call at the [facility], [place] [address], [address], [place], [place]  [address] ? MAIN: [phone]) [phone] ? TOLL FREE: [phone] ?  FAX [phone] [email or web address]          No acetaminophen/Tylenol until after 12;00 pm today if needed.   Post Anesthesia Home Care Instructions  Activity: Get plenty of rest for the remainder of the day. A responsible individual must stay with you for 24 hours following the procedure.  For the next 24 hours, DO NOT: -Drive a car -Advertising copywriter -Drink  alcoholic beverages -Take any medication unless instructed by your physician -Make any legal decisions or sign important papers.  Meals: Start with liquid foods such as gelatin or soup. Progress to regular foods as tolerated. Avoid greasy, spicy, heavy foods. If nausea and/or vomiting occur, drink only clear liquids until the nausea and/or vomiting subsides. Call your physician if vomiting continues.  Special Instructions/Symptoms: Your throat may feel dry or sore from the anesthesia or the breathing tube placed in your throat during surgery. If this causes discomfort, gargle with warm salt water. The discomfort should disappear within 24 hours.

## 2022-04-15 NOTE — Transfer of Care (Signed)
Immediate Anesthesia Transfer of Care Note  Patient: Crystal Kirby  Procedure(s) Performed: Procedure(s) (LRB): EXCISION OF SUBCUTANEOUS CYST/MASS POSTERIOR NECK (N/A)  Patient Location: PACU  Anesthesia Type: MAC  Level of Consciousness: awake, sedated, patient cooperative and responds to stimulation  Airway & Oxygen Therapy: Patient Spontanous Breathing and Patient connected to [place] oxygen  Post-op Assessment: Report given to PACU RN, Post -op Vital signs reviewed and stable and Patient moving all extremities  Post vital signs: Reviewed and stable  Complications: No apparent anesthesia complications

## 2022-04-15 NOTE — Anesthesia Postprocedure Evaluation (Signed)
Anesthesia Post Note  Patient: Crystal Kirby  Procedure(s) Performed: EXCISION OF SUBCUTANEOUS CYST/MASS POSTERIOR NECK     Patient location during evaluation: PACU Anesthesia Type: MAC Level of consciousness: awake and alert Pain management: pain level controlled Vital Signs Assessment: post-procedure vital signs reviewed and stable Respiratory status: spontaneous breathing, nonlabored ventilation, respiratory function stable and patient connected to nasal cannula oxygen Cardiovascular status: stable and blood pressure returned to baseline Postop Assessment: no apparent nausea or vomiting Anesthetic complications: no  No notable events documented.  Last Vitals:  Vitals:   04/15/22 0845 04/15/22 0900  BP: 124/64 114/64  Pulse: 68 69  Resp: 14 13  Temp:    SpO2: 92% (!) 88%    Last Pain:  Vitals:   04/15/22 0900  TempSrc:   PainSc: 0-No pain                 Sirenia Whitis S

## 2022-04-15 NOTE — Op Note (Signed)
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  The posterior neck area was prepped and draped in usual sterile fashion.  After infiltration with quarter percent Marcaine, an elliptical incision was made over the surface of the mass.  The dermis and soft tissues were dissected with cautery and then a combination of cautery and blunt dissection was used to free the mass of attachments to the surrounding soft tissue as well as to the underlying fascia.  The lesion was a multilobulated adipose mass.  This was excised intact and handed off for pathology.  The wound was inspected and hemostasis ensured with cautery.  The incision was closed with interrupted deep dermal 3-0 Vicryl, running subcuticular 4 Monocryl and Dermabond.  The patient was then awakened, returned to the supine position and taken to PACU in stable condition.    All counts were correct at the completion of the case.

## 2022-04-18 ENCOUNTER — Encounter (HOSPITAL_BASED_OUTPATIENT_CLINIC_OR_DEPARTMENT_OTHER): Payer: Self-pay | Admitting: Surgery

## 2022-04-18 LAB — SURGICAL PATHOLOGY

## 2022-04-27 DIAGNOSIS — H0520 Unspecified exophthalmos: Secondary | ICD-10-CM | POA: Diagnosis not present

## 2022-04-27 DIAGNOSIS — H43821 Vitreomacular adhesion, right eye: Secondary | ICD-10-CM | POA: Diagnosis not present

## 2022-04-27 DIAGNOSIS — H04123 Dry eye syndrome of bilateral lacrimal glands: Secondary | ICD-10-CM | POA: Diagnosis not present

## 2022-04-27 DIAGNOSIS — Z0100 Encounter for examination of eyes and vision without abnormal findings: Secondary | ICD-10-CM | POA: Diagnosis not present

## 2022-04-27 DIAGNOSIS — E0500 Thyrotoxicosis with diffuse goiter without thyrotoxic crisis or storm: Secondary | ICD-10-CM | POA: Diagnosis not present

## 2022-04-27 DIAGNOSIS — H524 Presbyopia: Secondary | ICD-10-CM | POA: Diagnosis not present

## 2022-04-27 DIAGNOSIS — H2513 Age-related nuclear cataract, bilateral: Secondary | ICD-10-CM | POA: Diagnosis not present

## 2022-05-05 DIAGNOSIS — F1120 Opioid dependence, uncomplicated: Secondary | ICD-10-CM | POA: Diagnosis not present

## 2022-05-05 DIAGNOSIS — M25511 Pain in right shoulder: Secondary | ICD-10-CM | POA: Diagnosis not present

## 2022-05-05 DIAGNOSIS — M5136 Other intervertebral disc degeneration, lumbar region: Secondary | ICD-10-CM | POA: Diagnosis not present

## 2022-05-05 DIAGNOSIS — M5417 Radiculopathy, lumbosacral region: Secondary | ICD-10-CM | POA: Diagnosis not present

## 2022-06-27 DIAGNOSIS — F17210 Nicotine dependence, cigarettes, uncomplicated: Secondary | ICD-10-CM | POA: Diagnosis not present

## 2022-06-27 DIAGNOSIS — I700 Atherosclerosis of aorta: Secondary | ICD-10-CM | POA: Diagnosis not present

## 2022-06-27 DIAGNOSIS — G894 Chronic pain syndrome: Secondary | ICD-10-CM | POA: Diagnosis not present

## 2022-06-27 DIAGNOSIS — E785 Hyperlipidemia, unspecified: Secondary | ICD-10-CM | POA: Diagnosis not present

## 2022-06-27 DIAGNOSIS — E039 Hypothyroidism, unspecified: Secondary | ICD-10-CM | POA: Diagnosis not present

## 2022-06-27 DIAGNOSIS — R7303 Prediabetes: Secondary | ICD-10-CM | POA: Diagnosis not present

## 2022-06-27 DIAGNOSIS — I2584 Coronary atherosclerosis due to calcified coronary lesion: Secondary | ICD-10-CM | POA: Diagnosis not present

## 2022-06-27 DIAGNOSIS — F330 Major depressive disorder, recurrent, mild: Secondary | ICD-10-CM | POA: Diagnosis not present

## 2022-08-04 DIAGNOSIS — M5136 Other intervertebral disc degeneration, lumbar region: Secondary | ICD-10-CM | POA: Diagnosis not present

## 2022-08-04 DIAGNOSIS — F1120 Opioid dependence, uncomplicated: Secondary | ICD-10-CM | POA: Diagnosis not present

## 2022-08-04 DIAGNOSIS — M5417 Radiculopathy, lumbosacral region: Secondary | ICD-10-CM | POA: Diagnosis not present

## 2022-08-04 DIAGNOSIS — M25511 Pain in right shoulder: Secondary | ICD-10-CM | POA: Diagnosis not present

## 2022-09-28 DIAGNOSIS — L570 Actinic keratosis: Secondary | ICD-10-CM | POA: Diagnosis not present

## 2022-09-28 DIAGNOSIS — D2361 Other benign neoplasm of skin of right upper limb, including shoulder: Secondary | ICD-10-CM | POA: Diagnosis not present

## 2022-09-28 DIAGNOSIS — L565 Disseminated superficial actinic porokeratosis (DSAP): Secondary | ICD-10-CM | POA: Diagnosis not present

## 2022-09-28 DIAGNOSIS — L821 Other seborrheic keratosis: Secondary | ICD-10-CM | POA: Diagnosis not present

## 2022-10-28 DIAGNOSIS — F1120 Opioid dependence, uncomplicated: Secondary | ICD-10-CM | POA: Diagnosis not present

## 2022-10-28 DIAGNOSIS — M51362 Other intervertebral disc degeneration, lumbar region with discogenic back pain and lower extremity pain: Secondary | ICD-10-CM | POA: Diagnosis not present

## 2022-10-28 DIAGNOSIS — M25511 Pain in right shoulder: Secondary | ICD-10-CM | POA: Diagnosis not present

## 2022-10-28 DIAGNOSIS — M5417 Radiculopathy, lumbosacral region: Secondary | ICD-10-CM | POA: Diagnosis not present

## 2022-11-24 DIAGNOSIS — R7303 Prediabetes: Secondary | ICD-10-CM | POA: Diagnosis not present

## 2022-11-24 DIAGNOSIS — E039 Hypothyroidism, unspecified: Secondary | ICD-10-CM | POA: Diagnosis not present

## 2022-11-24 DIAGNOSIS — E785 Hyperlipidemia, unspecified: Secondary | ICD-10-CM | POA: Diagnosis not present

## 2022-11-24 DIAGNOSIS — E559 Vitamin D deficiency, unspecified: Secondary | ICD-10-CM | POA: Diagnosis not present

## 2022-11-24 DIAGNOSIS — E669 Obesity, unspecified: Secondary | ICD-10-CM | POA: Diagnosis not present

## 2022-11-29 DIAGNOSIS — I10 Essential (primary) hypertension: Secondary | ICD-10-CM | POA: Diagnosis not present

## 2022-11-29 DIAGNOSIS — Z0000 Encounter for general adult medical examination without abnormal findings: Secondary | ICD-10-CM | POA: Diagnosis not present

## 2022-11-29 DIAGNOSIS — F17210 Nicotine dependence, cigarettes, uncomplicated: Secondary | ICD-10-CM | POA: Diagnosis not present

## 2022-11-29 DIAGNOSIS — E785 Hyperlipidemia, unspecified: Secondary | ICD-10-CM | POA: Diagnosis not present

## 2022-11-29 DIAGNOSIS — F330 Major depressive disorder, recurrent, mild: Secondary | ICD-10-CM | POA: Diagnosis not present

## 2022-11-29 DIAGNOSIS — I700 Atherosclerosis of aorta: Secondary | ICD-10-CM | POA: Diagnosis not present

## 2022-11-29 DIAGNOSIS — Z1331 Encounter for screening for depression: Secondary | ICD-10-CM | POA: Diagnosis not present

## 2022-11-29 DIAGNOSIS — E039 Hypothyroidism, unspecified: Secondary | ICD-10-CM | POA: Diagnosis not present

## 2022-11-29 DIAGNOSIS — R82998 Other abnormal findings in urine: Secondary | ICD-10-CM | POA: Diagnosis not present

## 2022-11-29 DIAGNOSIS — G894 Chronic pain syndrome: Secondary | ICD-10-CM | POA: Diagnosis not present

## 2022-11-29 DIAGNOSIS — G720 Drug-induced myopathy: Secondary | ICD-10-CM | POA: Diagnosis not present

## 2022-11-29 DIAGNOSIS — Z1339 Encounter for screening examination for other mental health and behavioral disorders: Secondary | ICD-10-CM | POA: Diagnosis not present

## 2023-02-07 DIAGNOSIS — M5417 Radiculopathy, lumbosacral region: Secondary | ICD-10-CM | POA: Diagnosis not present

## 2023-02-07 DIAGNOSIS — F1120 Opioid dependence, uncomplicated: Secondary | ICD-10-CM | POA: Diagnosis not present

## 2023-02-09 DIAGNOSIS — M629 Disorder of muscle, unspecified: Secondary | ICD-10-CM | POA: Diagnosis not present

## 2023-03-22 ENCOUNTER — Emergency Department (HOSPITAL_COMMUNITY)
Admission: EM | Admit: 2023-03-22 | Discharge: 2023-03-23 | Disposition: A | Discharge status: Home / Self Care | Attending: Emergency Medicine | Admitting: Emergency Medicine

## 2023-03-22 ENCOUNTER — Emergency Department (HOSPITAL_COMMUNITY)

## 2023-03-22 ENCOUNTER — Encounter (HOSPITAL_COMMUNITY): Payer: Self-pay | Admitting: Emergency Medicine

## 2023-03-22 ENCOUNTER — Other Ambulatory Visit: Payer: Self-pay

## 2023-03-22 DIAGNOSIS — D72829 Elevated white blood cell count, unspecified: Secondary | ICD-10-CM | POA: Insufficient documentation

## 2023-03-22 DIAGNOSIS — R1011 Right upper quadrant pain: Secondary | ICD-10-CM

## 2023-03-22 DIAGNOSIS — R1084 Generalized abdominal pain: Secondary | ICD-10-CM | POA: Diagnosis not present

## 2023-03-22 DIAGNOSIS — I10 Essential (primary) hypertension: Secondary | ICD-10-CM | POA: Diagnosis not present

## 2023-03-22 DIAGNOSIS — N390 Urinary tract infection, site not specified: Secondary | ICD-10-CM | POA: Insufficient documentation

## 2023-03-22 DIAGNOSIS — R109 Unspecified abdominal pain: Secondary | ICD-10-CM | POA: Diagnosis not present

## 2023-03-22 DIAGNOSIS — R9389 Abnormal findings on diagnostic imaging of other specified body structures: Secondary | ICD-10-CM | POA: Diagnosis not present

## 2023-03-22 LAB — COMPREHENSIVE METABOLIC PANEL
ALT: 24 U/L (ref 0–44)
AST: 53 U/L — ABNORMAL HIGH (ref 15–41)
Albumin: 4.6 g/dL (ref 3.5–5.0)
Alkaline Phosphatase: 83 U/L (ref 38–126)
Anion gap: 17 — ABNORMAL HIGH (ref 5–15)
BUN: 19 mg/dL (ref 8–23)
CO2: 19 mmol/L — ABNORMAL LOW (ref 22–32)
Calcium: 11 mg/dL — ABNORMAL HIGH (ref 8.9–10.3)
Chloride: 96 mmol/L — ABNORMAL LOW (ref 98–111)
Creatinine, Ser: 1.13 mg/dL — ABNORMAL HIGH (ref 0.44–1.00)
GFR, Estimated: 53 mL/min — ABNORMAL LOW (ref >60)
Glucose, Bld: 137 mg/dL — ABNORMAL HIGH (ref 70–99)
Potassium: 4.9 mmol/L (ref 3.5–5.1)
Sodium: 132 mmol/L — ABNORMAL LOW (ref 135–145)
Total Bilirubin: 1.4 mg/dL — ABNORMAL HIGH (ref 0.0–1.2)
Total Protein: 8.1 g/dL (ref 6.5–8.1)

## 2023-03-22 LAB — URINALYSIS, ROUTINE W REFLEX MICROSCOPIC
Bacteria, UA: NONE SEEN
Bilirubin Urine: NEGATIVE
Glucose, UA: NEGATIVE mg/dL
Ketones, ur: NEGATIVE mg/dL
Nitrite: NEGATIVE
Protein, ur: 30 mg/dL — AB
Specific Gravity, Urine: >1.046 — ABNORMAL HIGH (ref 1.005–1.030)
pH: 5 (ref 5.0–8.0)

## 2023-03-22 LAB — CBC WITH DIFFERENTIAL/PLATELET
Abs Immature Granulocytes: 0.04 10*3/uL (ref 0.00–0.07)
Basophils Absolute: 0 10*3/uL (ref 0.0–0.1)
Basophils Relative: 0 %
Eosinophils Absolute: 0 10*3/uL (ref 0.0–0.5)
Eosinophils Relative: 0 %
HCT: 45.7 % (ref 36.0–46.0)
Hemoglobin: 16.1 g/dL — ABNORMAL HIGH (ref 12.0–15.0)
Immature Granulocytes: 0 %
Lymphocytes Relative: 15 %
Lymphs Abs: 1.9 10*3/uL (ref 0.7–4.0)
MCH: 32.9 pg (ref 26.0–34.0)
MCHC: 35.2 g/dL (ref 30.0–36.0)
MCV: 93.3 fL (ref 80.0–100.0)
Monocytes Absolute: 0.7 10*3/uL (ref 0.1–1.0)
Monocytes Relative: 6 %
Neutro Abs: 10.2 10*3/uL — ABNORMAL HIGH (ref 1.7–7.7)
Neutrophils Relative %: 79 %
Platelets: 403 10*3/uL — ABNORMAL HIGH (ref 150–400)
RBC: 4.9 MIL/uL (ref 3.87–5.11)
RDW: 13.8 % (ref 11.5–15.5)
WBC: 12.8 10*3/uL — ABNORMAL HIGH (ref 4.0–10.5)
nRBC: 0 % (ref 0.0–0.2)

## 2023-03-22 LAB — LIPASE, BLOOD: Lipase: 26 U/L (ref 11–51)

## 2023-03-22 LAB — TROPONIN I (HIGH SENSITIVITY)
Troponin I (High Sensitivity): 8 ng/L (ref <18)
Troponin I (High Sensitivity): 8 ng/L (ref <18)

## 2023-03-22 MED ORDER — HYDROMORPHONE HCL 1 MG/ML IJ SOLN
1.0000 mg | Freq: Once | INTRAMUSCULAR | Status: AC
  Administered 2023-03-22: 1 mg via INTRAVENOUS
  Filled 2023-03-22: qty 1

## 2023-03-22 MED ORDER — SODIUM CHLORIDE 0.9 % IV BOLUS
500.0000 mL | Freq: Once | INTRAVENOUS | Status: AC
  Administered 2023-03-22: 500 mL via INTRAVENOUS

## 2023-03-22 MED ORDER — ONDANSETRON HCL 4 MG/2ML IJ SOLN
4.0000 mg | Freq: Once | INTRAMUSCULAR | Status: AC
  Administered 2023-03-22: 4 mg via INTRAVENOUS
  Filled 2023-03-22: qty 2

## 2023-03-22 MED ORDER — IOHEXOL 350 MG/ML SOLN
75.0000 mL | Freq: Once | INTRAVENOUS | Status: AC | PRN
  Administered 2023-03-22: 75 mL via INTRAVENOUS

## 2023-03-22 MED ORDER — PANTOPRAZOLE SODIUM 40 MG IV SOLR
40.0000 mg | Freq: Once | INTRAVENOUS | Status: AC
  Administered 2023-03-22: 40 mg via INTRAVENOUS
  Filled 2023-03-22: qty 10

## 2023-03-22 NOTE — ED Triage Notes (Signed)
 ABD pain since Monday afternoon. Endorses N/V/D. Hx of peptic ulcers.

## 2023-03-22 NOTE — ED Notes (Signed)
Pt informed of need for UA.

## 2023-03-22 NOTE — ED Provider Notes (Signed)
 [place] EMERGENCY DEPARTMENT AT [facility] Provider Note   CSN: [id number] Arrival date & time: 03/22/23  1314     History  Chief Complaint  Patient presents with   Abdominal Pain    Crystal Kirby is a 68 y.o. female.  With a history of peptic ulcers, hyperlipidemia and status post tubal ligation who presents to the ED for abdominal pain.  Abdominal pain for the last 3 days.  Localized over right upper quadrant.  Some associated right shoulder pain along with nausea and vomiting.  Nonbloody nonbilious emesis.  No fevers chills chest pain or shortness of breath.  This feels similar to prior episodes of peptic ulcers which she had many years ago.  No urinary symptoms   Abdominal Pain      Home Medications Prior to Admission medications   Medication Sig Start Date End Date Taking? Authorizing Provider  morphine (MSIR) 15 MG tablet Take 1 tablet (15 mg total) by mouth every 8 (eight) hours as needed for up to 3 days for severe pain (pain score 7-10). 03/23/23 03/26/23 Yes Royanne Foots, DO  nitrofurantoin, macrocrystal-monohydrate, (MACROBID) 100 MG capsule Take 1 capsule (100 mg total) by mouth 2 (two) times daily. 03/23/23  Yes Estelle June A, DO  ondansetron (ZOFRAN) 4 MG tablet Take 1 tablet (4 mg total) by mouth every 6 (six) hours. 03/23/23  Yes Royanne Foots, DO  sucralfate (CARAFATE) 1 g tablet Take 1 tablet (1 g total) by mouth 4 (four) times daily -  with meals and at bedtime for 7 days. 03/23/23 03/30/23 Yes Royanne Foots, DO  amLODipine (NORVASC) 5 MG tablet Take 1 tablet (5 mg total) by mouth daily. 10/26/21   Jeannie Fend, PA-C  Evolocumab (REPATHA) 140 MG/ML SOSY Inject into the skin. Patient has not started medication yet.    [provider]  HYDROcodone-acetaminophen (NORCO/VICODIN) 5-325 MG tablet Take 1 tablet by mouth every 6 (six) hours as needed for pain. 07/08/20   [provider]  levothyroxine (SYNTHROID) 175 MCG tablet  Take 175 mcg by mouth daily before breakfast.    [provider]  Multiple Vitamin (MULTIVITAMIN) tablet Take 1 tablet by mouth daily.      [provider]  pantoprazole (PROTONIX) 40 MG tablet Take 1 tablet (40 mg total) by mouth 2 (two) times daily. 10/26/21 04/15/22  Jeannie Fend, PA-C  PARoxetine (PAXIL) 20 MG tablet TAKE 1 TABLET BY MOUTH 2 TIMES DAILY. Patient taking differently: Take 40 mg by mouth daily. 08/08/11   Cassell Clement, MD      Allergies    Promethazine hcl    Review of Systems   Review of Systems  Gastrointestinal:  Positive for abdominal pain.    Physical Exam Updated Vital Signs BP (!) 183/89   Pulse 91   Temp 98.3 F (36.8 C) (Oral)   Resp 16   SpO2 96%  Physical Exam Vitals and nursing note reviewed.  HENT:     Head: Normocephalic and atraumatic.  Eyes:     Pupils: Pupils are equal, round, and reactive to light.  Cardiovascular:     Rate and Rhythm: Normal rate and regular rhythm.  Pulmonary:     Effort: Pulmonary effort is normal.     Breath sounds: Normal breath sounds.  Abdominal:     Palpations: Abdomen is soft.     Tenderness: There is abdominal tenderness in the right upper quadrant. There is no guarding or rebound.  Skin:  General: Skin is warm and dry.  Neurological:     Mental Status: She is alert.  Psychiatric:        Mood and Affect: Mood normal.     ED Results / Procedures / Treatments   Labs (all labs ordered are listed, but only abnormal results are displayed) Labs Reviewed  COMPREHENSIVE METABOLIC PANEL - Abnormal; Notable for the following components:      Result Value   Sodium 132 (*)    Chloride 96 (*)    CO2 19 (*)    Glucose, Bld 137 (*)    Creatinine, Ser 1.13 (*)    Calcium 11.0 (*)    AST 53 (*)    Total Bilirubin 1.4 (*)    GFR, Estimated 53 (*)    Anion gap 17 (*)    All other components within normal limits  CBC WITH DIFFERENTIAL/PLATELET - Abnormal; Notable for the following  components:   WBC 12.8 (*)    Hemoglobin 16.1 (*)    Platelets 403 (*)    Neutro Abs 10.2 (*)    All other components within normal limits  URINALYSIS, ROUTINE W REFLEX MICROSCOPIC - Abnormal; Notable for the following components:   APPearance HAZY (*)    Specific Gravity, Urine >1.046 (*)    Hgb urine dipstick SMALL (*)    Protein, ur 30 (*)    Leukocytes,Ua MODERATE (*)    All other components within normal limits  URINE CULTURE  LIPASE, BLOOD  TROPONIN I (HIGH SENSITIVITY)  TROPONIN I (HIGH SENSITIVITY)    EKG EKG Interpretation Date/Time:  Wednesday March 22 2023 23:43:06 EDT Ventricular Rate:  87 PR Interval:  153 QRS Duration:  103 QT Interval:  383 QTC Calculation: 461 R Axis:   -10  Text Interpretation: Sinus rhythm Confirmed by Estelle June [phone]) on 03/23/2023 12:18:22 AM  Radiology US Abdomen Limited RUQ (LIVER/GB) Result Date: 03/22/2023 CLINICAL DATA:  Right upper quadrant pain. EXAM: ULTRASOUND ABDOMEN LIMITED RIGHT UPPER QUADRANT COMPARISON:  July 28, 2020 FINDINGS: Gallbladder: No gallstones or wall thickening visualized (4.2 mm). No sonographic Murphy sign noted by sonographer. Common bile duct: Diameter: 2.6 mm Liver: No focal lesion identified. Within normal limits in parenchymal echogenicity. Portal vein is patent on color Doppler imaging with normal direction of blood flow towards the liver. Other: None. IMPRESSION: Unremarkable right upper quadrant ultrasound. Electronically Signed   By: Aram Candela M.D.   On: 03/22/2023 23:57   CT ABDOMEN PELVIS W CONTRAST Result Date: 03/22/2023 CLINICAL DATA:  Abdominal pain. EXAM: CT ABDOMEN AND PELVIS WITH CONTRAST TECHNIQUE: Multidetector CT imaging of the abdomen and pelvis was performed using the standard protocol following bolus administration of intravenous contrast. RADIATION DOSE REDUCTION: This exam was performed according to the departmental dose-optimization program which includes automated exposure  control, adjustment of the mA and/or kV according to patient size and/or use of iterative reconstruction technique. CONTRAST:  75mL OMNIPAQUE IOHEXOL 350 MG/ML SOLN COMPARISON:  CT abdomen pelvis dated 10/26/2021. FINDINGS: Lower chest: The visualized lung bases are clear. No intra-abdominal free air or free fluid. Hepatobiliary: The liver is unremarkable no biliary dilatation. The gallbladder is unremarkable. Pancreas: Unremarkable. No pancreatic ductal dilatation or surrounding inflammatory changes. Spleen: Normal in size without focal abnormality. Adrenals/Urinary Tract: The right adrenal glands unremarkable. Indeterminate 15 mm left adrenal nodule similar to prior CT. There is no hydronephrosis on either side. There is symmetric enhancement and excretion of contrast by both kidneys. The visualized ureters appear unremarkable. The urinary bladder  is collapsed. Stomach/Bowel: There is no bowel obstruction or active inflammation. The appendix is normal. Vascular/Lymphatic: Moderate aortoiliac atherosclerotic disease. The IVC is unremarkable. No portal venous gas. There is no adenopathy. Reproductive: The uterus is anteverted and grossly unremarkable. No suspicious adnexal masses. Other: None Musculoskeletal: Osteopenia with scoliosis and degenerative changes. No acute osseous pathology. IMPRESSION: No acute intra-abdominal or pelvic pathology. Electronically Signed   By: Elgie Collard M.D.   On: 03/22/2023 16:45   DG Chest 2 View Result Date: 03/22/2023 CLINICAL DATA:  Right upper quadrant abdominal pain. EXAM: CHEST - 2 VIEW COMPARISON:  None Available. FINDINGS: Bilateral lung fields are clear. Bilateral costophrenic angles are clear. Elevated right hemidiaphragm noted. Number Normal cardio-mediastinal silhouette. No acute osseous abnormalities. The soft tissues are within normal limits. IMPRESSION: No active cardiopulmonary disease. Electronically Signed   By: Jules Schick M.D.   On: 03/22/2023 15:45     Procedures Procedures    Medications Ordered in ED Medications  sodium chloride 0.9 % bolus 500 mL (0 mLs Intravenous Stopped 03/23/23 0009)  HYDROmorphone (DILAUDID) injection 1 mg (1 mg Intravenous Given 03/22/23 1814)  ondansetron (ZOFRAN) injection 4 mg (4 mg Intravenous Given 03/22/23 1814)  pantoprazole (PROTONIX) injection 40 mg (40 mg Intravenous Given 03/22/23 1814)  iohexol (OMNIPAQUE) 350 MG/ML injection 75 mL (75 mLs Intravenous Contrast Given 03/22/23 1526)  HYDROmorphone (DILAUDID) injection 1 mg (1 mg Intravenous Given 03/22/23 2252)  ondansetron (ZOFRAN) injection 4 mg (4 mg Intravenous Given 03/22/23 2251)    ED Course/ Medical Decision Making/ A&P Clinical Course as of 03/23/23 0018  Wed Mar 22, 2023  2230 Initial CT was negative.  Labs notable for leukocytosis with elevation in T. bili.  UA has contaminated sample which we will need to send for culture.  Ultrasound has been ordered to evaluate for potential gallbladder pathology not seen on CT considering increased sensitivity of ultrasound when evaluating for cholecystitis [MP]  Thu Mar 23, 2023  0003 Ultrasound shows no acute abnormalities of gallbladder.  Urine cultures pending.  Reevaluated patient.  Pain is well-controlled now after another dose of analgesic Zofran and IV fluids.  We discussed rationale for watching and waiting to see if urine culture grows versus starting antibiotics She would like to start antibiotics we will give her 5 days worth of Macrobid instructed to follow-up on the results of her culture Also called in prescription for Zofran Carafate to help with symptomatic relief She is already on pantoprazole Will prescribe short course of morphine to help with severe pain She will follow-up with her doctor to discuss GI referral and today's visit [MP]    Clinical Course User Index [MP] Royanne Foots, DO                                 Medical Decision Making 68 year old female with history  as above presenting for right upper quadrant pain nausea vomiting.  No fevers chills urinary symptoms or other complaints at this time.  Feels similar to prior episodes of peptic ulcer disease.  Significant tenderness in the right upper quadrant.  Afebrile and hypertensive here.  Differential diagnosis includes: Recurrent peptic ulcer Acute intra-abdominal infectious/inflammatory process such as appendicitis, diverticulitis, pancreatitis and cholecystitis Urinary tract infection Atypical presentation for pneumonia Viral gastroenteritis  Will obtain laboratory workup including CBC with differential, metabolic panel, lipase and urinalysis along with CT abdomen pelvis  Amount and/or Complexity of Data Reviewed Labs: ordered.  Radiology: ordered.  Risk Prescription drug management.           Final Clinical Impression(s) / ED Diagnoses Final diagnoses:  Right upper quadrant abdominal pain  Urinary tract infection without hematuria, site unspecified    Rx / DC Orders ED Discharge Orders          Ordered    nitrofurantoin, macrocrystal-monohydrate, (MACROBID) 100 MG capsule  2 times daily        03/23/23 0015    sucralfate (CARAFATE) 1 g tablet  3 times daily with meals & bedtime        03/23/23 0015    morphine (MSIR) 15 MG tablet  Every 8 hours PRN        03/23/23 0015    ondansetron (ZOFRAN) 4 MG tablet  Every 6 hours        03/23/23 0017              Royanne Foots, DO 03/23/23 0018

## 2023-03-22 NOTE — ED Notes (Signed)
 Patient transported to ultrasound.

## 2023-03-22 NOTE — ED Provider Notes (Incomplete)
 [facility] Provider Note   CSN: [id number] Arrival date & time: 03/22/23  1314     History {Add pertinent medical, surgical, social history, OB history to HPI:1} Chief Complaint  Patient presents with  . Abdominal Pain    ELNORE COSENS is a 68 y.o. female.  With a history of peptic ulcers, hyperlipidemia and status post tubal ligation who presents to the ED for abdominal pain.  Abdominal pain for the last 3 days.  Localized over right upper quadrant.  Some associated right shoulder pain along with nausea and vomiting.  Nonbloody nonbilious emesis.  No fevers chills chest pain or shortness of breath.  This feels similar to prior episodes of peptic ulcers which she had many years ago.  No urinary symptoms   Abdominal Pain      Home Medications Prior to Admission medications   Medication Sig Start Date End Date Taking? Authorizing Provider  amLODipine (NORVASC) 5 MG tablet Take 1 tablet (5 mg total) by mouth daily. 10/26/21   Jeannie Fend, PA-C  Evolocumab (REPATHA) 140 MG/ML SOSY Inject into the skin. Patient has not started medication yet.    [provider]  HYDROcodone-acetaminophen (NORCO/VICODIN) 5-325 MG tablet Take 1 tablet by mouth every 6 (six) hours as needed for pain. 07/08/20   [provider]  levothyroxine (SYNTHROID) 175 MCG tablet Take 175 mcg by mouth daily before breakfast.    [provider]  Multiple Vitamin (MULTIVITAMIN) tablet Take 1 tablet by mouth daily.      [provider]  pantoprazole (PROTONIX) 40 MG tablet Take 1 tablet (40 mg total) by mouth 2 (two) times daily. 10/26/21 04/15/22  Jeannie Fend, PA-C  PARoxetine (PAXIL) 20 MG tablet TAKE 1 TABLET BY MOUTH 2 TIMES DAILY. Patient taking differently: Take 40 mg by mouth daily. 08/08/11   Cassell Clement, MD      Allergies    Promethazine hcl    Review of Systems   Review of Systems  Gastrointestinal:  Positive  for abdominal pain.    Physical Exam Updated Vital Signs BP (!) 183/89   Pulse 91   Temp 98.3 F (36.8 C) (Oral)   Resp 16   SpO2 96%  Physical Exam Vitals and nursing note reviewed.  HENT:     Head: Normocephalic and atraumatic.  Eyes:     Pupils: Pupils are equal, round, and reactive to light.  Cardiovascular:     Rate and Rhythm: Normal rate and regular rhythm.  Pulmonary:     Effort: Pulmonary effort is normal.     Breath sounds: Normal breath sounds.  Abdominal:     Palpations: Abdomen is soft.     Tenderness: There is abdominal tenderness in the right upper quadrant. There is no guarding or rebound.  Skin:    General: Skin is warm and dry.  Neurological:     Mental Status: She is alert.  Psychiatric:        Mood and Affect: Mood normal.     ED Results / Procedures / Treatments   Labs (all labs ordered are listed, but only abnormal results are displayed) Labs Reviewed  COMPREHENSIVE METABOLIC PANEL - Abnormal; Notable for the following components:      Result Value   Sodium 132 (*)    Chloride 96 (*)    CO2 19 (*)    Glucose, Bld 137 (*)    Creatinine, Ser 1.13 (*)    Calcium 11.0 (*)  AST 53 (*)    Total Bilirubin 1.4 (*)    GFR, Estimated 53 (*)    Anion gap 17 (*)    All other components within normal limits  CBC WITH DIFFERENTIAL/PLATELET - Abnormal; Notable for the following components:   WBC 12.8 (*)    Hemoglobin 16.1 (*)    Platelets 403 (*)    Neutro Abs 10.2 (*)    All other components within normal limits  URINALYSIS, ROUTINE W REFLEX MICROSCOPIC - Abnormal; Notable for the following components:   APPearance HAZY (*)    Specific Gravity, Urine >1.046 (*)    Hgb urine dipstick SMALL (*)    Protein, ur 30 (*)    Leukocytes,Ua MODERATE (*)    All other components within normal limits  LIPASE, BLOOD  TROPONIN I (HIGH SENSITIVITY)  TROPONIN I (HIGH SENSITIVITY)    EKG None  Radiology CT ABDOMEN PELVIS W CONTRAST Result Date:  03/22/2023 CLINICAL DATA:  Abdominal pain. EXAM: CT ABDOMEN AND PELVIS WITH CONTRAST TECHNIQUE: Multidetector CT imaging of the abdomen and pelvis was performed using the standard protocol following bolus administration of intravenous contrast. RADIATION DOSE REDUCTION: This exam was performed according to the departmental dose-optimization program which includes automated exposure control, adjustment of the mA and/or kV according to patient size and/or use of iterative reconstruction technique. CONTRAST:  75mL OMNIPAQUE IOHEXOL 350 MG/ML SOLN COMPARISON:  CT abdomen pelvis dated 10/26/2021. FINDINGS: Lower chest: The visualized lung bases are clear. No intra-abdominal free air or free fluid. Hepatobiliary: The liver is unremarkable no biliary dilatation. The gallbladder is unremarkable. Pancreas: Unremarkable. No pancreatic ductal dilatation or surrounding inflammatory changes. Spleen: Normal in size without focal abnormality. Adrenals/Urinary Tract: The right adrenal glands unremarkable. Indeterminate 15 mm left adrenal nodule similar to prior CT. There is no hydronephrosis on either side. There is symmetric enhancement and excretion of contrast by both kidneys. The visualized ureters appear unremarkable. The urinary bladder is collapsed. Stomach/Bowel: There is no bowel obstruction or active inflammation. The appendix is normal. Vascular/Lymphatic: Moderate aortoiliac atherosclerotic disease. The IVC is unremarkable. No portal venous gas. There is no adenopathy. Reproductive: The uterus is anteverted and grossly unremarkable. No suspicious adnexal masses. Other: None Musculoskeletal: Osteopenia with scoliosis and degenerative changes. No acute osseous pathology. IMPRESSION: No acute intra-abdominal or pelvic pathology. Electronically Signed   By: Elgie Collard M.D.   On: 03/22/2023 16:45   DG Chest 2 View Result Date: 03/22/2023 CLINICAL DATA:  Right upper quadrant abdominal pain. EXAM: CHEST - 2 VIEW  COMPARISON:  None Available. FINDINGS: Bilateral lung fields are clear. Bilateral costophrenic angles are clear. Elevated right hemidiaphragm noted. Number Normal cardio-mediastinal silhouette. No acute osseous abnormalities. The soft tissues are within normal limits. IMPRESSION: No active cardiopulmonary disease. Electronically Signed   By: Jules Schick M.D.   On: 03/22/2023 15:45    Procedures Procedures  {Document cardiac monitor, telemetry assessment procedure when appropriate:1}  Medications Ordered in ED Medications  sodium chloride 0.9 % bolus 500 mL (500 mLs Intravenous New Bag/Given 03/22/23 2307)  HYDROmorphone (DILAUDID) injection 1 mg (1 mg Intravenous Given 03/22/23 1814)  ondansetron (ZOFRAN) injection 4 mg (4 mg Intravenous Given 03/22/23 1814)  pantoprazole (PROTONIX) injection 40 mg (40 mg Intravenous Given 03/22/23 1814)  iohexol (OMNIPAQUE) 350 MG/ML injection 75 mL (75 mLs Intravenous Contrast Given 03/22/23 1526)  HYDROmorphone (DILAUDID) injection 1 mg (1 mg Intravenous Given 03/22/23 2252)  ondansetron (ZOFRAN) injection 4 mg (4 mg Intravenous Given 03/22/23 2251)    ED  Course/ Medical Decision Making/ A&P   {   Click here for ABCD2, HEART and other calculatorsREFRESH Note before signing :1}                              Medical Decision Making 68 year old female with history as above presenting for right upper quadrant pain nausea vomiting.  No fevers chills urinary symptoms or other complaints at this time.  Feels similar to prior episodes of peptic ulcer disease.  Significant tenderness in the right upper quadrant.  Afebrile and hypertensive here.  Differential diagnosis includes: Recurrent peptic ulcer Acute intra-abdominal infectious/inflammatory process such as appendicitis, diverticulitis, pancreatitis and cholecystitis Urinary tract infection Atypical presentation for pneumonia Viral gastroenteritis  Will obtain laboratory workup including CBC with  differential, metabolic panel, lipase and urinalysis along with CT abdomen pelvis  Amount and/or Complexity of Data Reviewed Labs: ordered. Radiology: ordered.  Risk Prescription drug management.   ***  {Document critical care time when appropriate:1} {Document review of labs and clinical decision tools ie heart score, Chads2Vasc2 etc:1}  {Document your independent review of radiology images, and any outside records:1} {Document your discussion with family members, caretakers, and with consultants:1} {Document social determinants of health affecting pt's care:1} {Document your decision making why or why not admission, treatments were needed:1} Final Clinical Impression(s) / ED Diagnoses Final diagnoses:  None    Rx / DC Orders ED Discharge Orders     None

## 2023-03-22 NOTE — ED Provider Notes (Signed)
 EMS triage Medical screening  Patient with onset of epigastric right upper quadrant abdominal pain on Monday has been constant.  Does not radiate to the back.  Multiple episodes of vomiting with no blood in the vomit.  Symptoms remind her of when she had stomach ulcer in the past.  She is allergic to Phenergan no diarrhea.  No fevers.  Past medical history significant for high cholesterol hypothyroid anxiety gastroesophageal reflux disease and hypertension.  Patient is an everyday smoker.  Patient has not had her gallbladder removed.  Temp is 98 pulse 96 respirations 18 blood pressure 152/88 oxygen saturation is 99%.  Patient very uncomfortable holding her upper part of her abdomen on the right side and right flank area. Abdomen has tenderness to palpation right upper quadrant. Heart regular rate and rhythm lungs clear to auscultation  Will order CT abdomen pelvis with contrast.  Will get chest x-ray will get CBC complete metabolic panel lipase troponins.  Patient hemodynamically stable   Vanetta Mulders, MD 03/22/23 1354

## 2023-03-22 NOTE — ED Notes (Signed)
 Patient returned from ultrasound.

## 2023-03-23 MED ORDER — SUCRALFATE 1 G PO TABS: 1.0000 g | ORAL_TABLET | Freq: Three times a day (TID) | ORAL | 0 refills | Status: AC

## 2023-03-23 MED ORDER — MORPHINE SULFATE 15 MG PO TABS: 15.0000 mg | ORAL_TABLET | Freq: Three times a day (TID) | ORAL | 0 refills | Status: AC | PRN

## 2023-03-23 MED ORDER — NITROFURANTOIN MONOHYD MACRO 100 MG PO CAPS: 100.0000 mg | ORAL_CAPSULE | Freq: Two times a day (BID) | ORAL | 0 refills | Status: AC

## 2023-03-23 MED ORDER — ONDANSETRON HCL 4 MG PO TABS
4.0000 mg | ORAL_TABLET | Freq: Four times a day (QID) | ORAL | 0 refills | Status: AC
Start: 2023-03-23 — End: ?

## 2023-03-23 NOTE — Discharge Instructions (Signed)
 You were seen in the emergency department for abdominal pain nausea vomiting We are not certain what is causing your pain based on the blood test CAT scan and ultrasound It may be related to another peptic ulcer You may have a urinary tract infection but we need to send it for culture to be sure In case you do have an infection we have called in an antibiotic (nitrofurantoin) to your pharmacy Please pick this up and begin taking as directed until we are able to follow-up on the results of the urine culture If the culture is negative you do not need to continue the antibiotic We have also called in a prescription for morphine to help with severe pain Do not drink alcohol or drive while taking morphine Take as directed We have also called in Carafate to help coat the esophagus and stomach which can help if this is related to an ulcer It is important that you continue taking pantoprazole Please do of the prescribed Zofran from your pharmacy as well to help with nausea and vomiting Follow-up with your primary doctor to discuss today's visit and need for GI referral as she may need another endoscopy Return to the emerged part for severe pain or any other concerns

## 2023-03-23 NOTE — ED Notes (Signed)
 Called [facility] lab regarding urine culture, they stated that they will be able to add it.

## 2023-03-23 NOTE — ED Notes (Signed)
 Attempted to called [facility] regarding adding urine culture lab work, no answer.

## 2023-03-24 LAB — URINE CULTURE

## 2023-03-26 ENCOUNTER — Telehealth: Payer: Self-pay

## 2023-03-26 NOTE — Telephone Encounter (Signed)
 Patient states she was sent a message to look into mychart, however she cannot get in, uurine culture reveals that it needs recollection. Patient states she is still having abdominal pain has finished morphine, nausea gone. Encouraged her to make sure she finishes all of Macrobid and to call her PCP on Monday for next steps, return precautions also discussed for worsening symptoms, fevers,

## 2023-03-28 ENCOUNTER — Other Ambulatory Visit (HOSPITAL_COMMUNITY): Payer: Self-pay | Admitting: Gastroenterology

## 2023-03-28 DIAGNOSIS — R1011 Right upper quadrant pain: Secondary | ICD-10-CM | POA: Diagnosis not present

## 2023-03-28 DIAGNOSIS — E782 Mixed hyperlipidemia: Secondary | ICD-10-CM | POA: Diagnosis not present

## 2023-03-28 DIAGNOSIS — F32A Depression, unspecified: Secondary | ICD-10-CM | POA: Diagnosis not present

## 2023-03-28 DIAGNOSIS — K219 Gastro-esophageal reflux disease without esophagitis: Secondary | ICD-10-CM | POA: Diagnosis not present

## 2023-03-29 ENCOUNTER — Encounter (HOSPITAL_COMMUNITY)
Admission: RE | Admit: 2023-03-29 | Discharge: 2023-03-29 | Disposition: A | Discharge status: Home / Self Care | Source: Ambulatory Visit | Attending: Gastroenterology | Admitting: Gastroenterology

## 2023-03-29 DIAGNOSIS — R1011 Right upper quadrant pain: Secondary | ICD-10-CM | POA: Diagnosis not present

## 2023-03-29 MED ORDER — TECHNETIUM TC 99M MEBROFENIN IV KIT
5.0000 | PACK | Freq: Once | INTRAVENOUS | Status: AC | PRN
  Administered 2023-03-29: 5 via INTRAVENOUS

## 2023-04-13 DIAGNOSIS — R1011 Right upper quadrant pain: Secondary | ICD-10-CM | POA: Diagnosis not present

## 2023-04-13 DIAGNOSIS — K279 Peptic ulcer, site unspecified, unspecified as acute or chronic, without hemorrhage or perforation: Secondary | ICD-10-CM | POA: Diagnosis not present

## 2023-04-13 DIAGNOSIS — M546 Pain in thoracic spine: Secondary | ICD-10-CM | POA: Diagnosis not present

## 2023-04-13 DIAGNOSIS — N3000 Acute cystitis without hematuria: Secondary | ICD-10-CM | POA: Diagnosis not present

## 2023-04-13 DIAGNOSIS — G8929 Other chronic pain: Secondary | ICD-10-CM | POA: Diagnosis not present

## 2023-04-13 DIAGNOSIS — Z1211 Encounter for screening for malignant neoplasm of colon: Secondary | ICD-10-CM | POA: Diagnosis not present

## 2023-05-04 DIAGNOSIS — M5417 Radiculopathy, lumbosacral region: Secondary | ICD-10-CM | POA: Diagnosis not present

## 2023-05-04 DIAGNOSIS — F1120 Opioid dependence, uncomplicated: Secondary | ICD-10-CM | POA: Diagnosis not present

## 2023-06-14 DIAGNOSIS — M542 Cervicalgia: Secondary | ICD-10-CM | POA: Diagnosis not present

## 2023-07-03 DIAGNOSIS — G47419 Narcolepsy without cataplexy: Secondary | ICD-10-CM | POA: Diagnosis not present

## 2023-07-03 DIAGNOSIS — F17210 Nicotine dependence, cigarettes, uncomplicated: Secondary | ICD-10-CM | POA: Diagnosis not present

## 2023-07-03 DIAGNOSIS — E669 Obesity, unspecified: Secondary | ICD-10-CM | POA: Diagnosis not present

## 2023-07-03 DIAGNOSIS — E039 Hypothyroidism, unspecified: Secondary | ICD-10-CM | POA: Diagnosis not present

## 2023-07-03 DIAGNOSIS — I700 Atherosclerosis of aorta: Secondary | ICD-10-CM | POA: Diagnosis not present

## 2023-07-03 DIAGNOSIS — R7303 Prediabetes: Secondary | ICD-10-CM | POA: Diagnosis not present

## 2023-07-03 DIAGNOSIS — I10 Essential (primary) hypertension: Secondary | ICD-10-CM | POA: Diagnosis not present

## 2023-07-03 DIAGNOSIS — F330 Major depressive disorder, recurrent, mild: Secondary | ICD-10-CM | POA: Diagnosis not present

## 2023-07-03 DIAGNOSIS — G720 Drug-induced myopathy: Secondary | ICD-10-CM | POA: Diagnosis not present

## 2023-07-03 DIAGNOSIS — G894 Chronic pain syndrome: Secondary | ICD-10-CM | POA: Diagnosis not present

## 2023-07-03 DIAGNOSIS — I2584 Coronary atherosclerosis due to calcified coronary lesion: Secondary | ICD-10-CM | POA: Diagnosis not present

## 2023-07-03 DIAGNOSIS — H0520 Unspecified exophthalmos: Secondary | ICD-10-CM | POA: Diagnosis not present

## 2023-08-02 DIAGNOSIS — M542 Cervicalgia: Secondary | ICD-10-CM | POA: Diagnosis not present

## 2023-08-02 DIAGNOSIS — F1120 Opioid dependence, uncomplicated: Secondary | ICD-10-CM | POA: Diagnosis not present

## 2023-08-02 DIAGNOSIS — M5417 Radiculopathy, lumbosacral region: Secondary | ICD-10-CM | POA: Diagnosis not present

## 2023-08-02 DIAGNOSIS — Z6828 Body mass index (BMI) 28.0-28.9, adult: Secondary | ICD-10-CM | POA: Diagnosis not present

## 2023-08-14 DIAGNOSIS — R21 Rash and other nonspecific skin eruption: Secondary | ICD-10-CM | POA: Diagnosis not present

## 2023-08-14 DIAGNOSIS — I10 Essential (primary) hypertension: Secondary | ICD-10-CM | POA: Diagnosis not present

## 2023-08-14 DIAGNOSIS — L237 Allergic contact dermatitis due to plants, except food: Secondary | ICD-10-CM | POA: Diagnosis not present

## 2023-09-01 DIAGNOSIS — I10 Essential (primary) hypertension: Secondary | ICD-10-CM | POA: Diagnosis not present

## 2023-09-01 DIAGNOSIS — K219 Gastro-esophageal reflux disease without esophagitis: Secondary | ICD-10-CM | POA: Diagnosis not present

## 2023-09-01 DIAGNOSIS — R21 Rash and other nonspecific skin eruption: Secondary | ICD-10-CM | POA: Diagnosis not present

## 2023-10-31 DIAGNOSIS — M5417 Radiculopathy, lumbosacral region: Secondary | ICD-10-CM | POA: Diagnosis not present

## 2023-10-31 DIAGNOSIS — F1120 Opioid dependence, uncomplicated: Secondary | ICD-10-CM | POA: Diagnosis not present
